# Patient Record
Sex: Male | Born: 1980 | Race: Black or African American | Hispanic: No | Marital: Single | Smoking: Current every day smoker
Health system: Southern US, Community
[De-identification: ages and names within clinical notes are randomized; demographics above are authoritative.]

## PROBLEM LIST (undated history)

## (undated) DIAGNOSIS — J939 Pneumothorax, unspecified: Secondary | ICD-10-CM

---

## 2015-07-31 ENCOUNTER — Encounter (HOSPITAL_BASED_OUTPATIENT_CLINIC_OR_DEPARTMENT_OTHER): Payer: Self-pay | Admitting: *Deleted

## 2015-07-31 ENCOUNTER — Emergency Department (HOSPITAL_BASED_OUTPATIENT_CLINIC_OR_DEPARTMENT_OTHER)
Admission: EM | Admit: 2015-07-31 | Discharge: 2015-07-31 | Disposition: A | Discharge status: Home / Self Care | Payer: Self-pay | Attending: Emergency Medicine | Admitting: Emergency Medicine

## 2015-07-31 DIAGNOSIS — F17200 Nicotine dependence, unspecified, uncomplicated: Secondary | ICD-10-CM | POA: Insufficient documentation

## 2015-07-31 DIAGNOSIS — N342 Other urethritis: Secondary | ICD-10-CM | POA: Insufficient documentation

## 2015-07-31 MED ORDER — AZITHROMYCIN 250 MG PO TABS
1000.0000 mg | ORAL_TABLET | Freq: Once | ORAL | Status: AC
  Administered 2015-07-31: 1000 mg via ORAL
  Filled 2015-07-31: qty 4

## 2015-07-31 MED ORDER — CEFTRIAXONE SODIUM 250 MG IJ SOLR
250.0000 mg | Freq: Once | INTRAMUSCULAR | Status: AC
  Administered 2015-07-31: 250 mg via INTRAMUSCULAR
  Filled 2015-07-31: qty 250

## 2015-07-31 NOTE — ED Notes (Signed)
Pt states that his girlfriend told him she has gonorrhea and said he needed to be treated, tonight having a "tingling sensation" when he urinates

## 2015-07-31 NOTE — Discharge Instructions (Signed)
Urethritis, Adult °Urethritis is an inflammation of the tube through which urine exits your bladder (urethra).  °CAUSES °Urethritis is often caused by an infection in your urethra. The infection can be viral, like herpes. The infection can also be bacterial, like gonorrhea. °RISK FACTORS °Risk factors of urethritis include: °· Having sex without using a condom. °· Having multiple sexual partners. °· Having poor hygiene. °SIGNS AND SYMPTOMS °Symptoms of urethritis are less noticeable in women than in men. These symptoms include: °· Burning feeling when you urinate (dysuria). °· Discharge from your urethra. °· Blood in your urine (hematuria). °· Urinating more than usual. °DIAGNOSIS  °To confirm a diagnosis of urethritis, your health care provider will do the following: °· Ask about your sexual history. °· Perform a physical exam. °· Have you provide a sample of your urine for lab testing. °· Use a cotton swab to gently collect a sample from your urethra for lab testing. °TREATMENT  °It is important to treat urethritis. Depending on the cause, untreated urethritis may lead to serious genital infections and possibly infertility. Urethritis caused by a bacterial infection is treated with antibiotic medicine. All sexual partners must be treated.  °HOME CARE INSTRUCTIONS °· Do not have sex until the test results are known and treatment is completed, even if your symptoms go away before you finish treatment. °· If you were prescribed an antibiotic, finish it all even if you start to feel better. °SEEK MEDICAL CARE IF:  °· Your symptoms are not improved in 3 days. °· Your symptoms are getting worse. °· You develop abdominal pain or pelvic pain (in women). °· You develop joint pain. °· You have a fever. °SEEK IMMEDIATE MEDICAL CARE IF:  °· You have severe pain in the belly, back, or side. °· You have repeated vomiting. °MAKE SURE YOU: °· Understand these instructions. °· Will watch your condition. °· Will get help right away  if you are not doing well or get worse. °  °This information is not intended to replace advice given to you by your health care provider. Make sure you discuss any questions you have with your health care provider. °  °Document Released: 12/20/2000 Document Revised: 11/10/2014 Document Reviewed: 02/24/2013 °Elsevier Interactive Patient Education ©2016 [employer] Inc[employer] ° °

## 2015-07-31 NOTE — ED Provider Notes (Signed)
CSN: [id number]     Arrival date & time 07/31/15  0444 History   First MD Initiated Contact with Patient 07/31/15 [phone]     Chief Complaint  Patient presents with  . Exposure to STD     (Consider location/radiation/quality/duration/timing/severity/associated sxs/prior Treatment) HPI  This is a 35 year old male whose girlfriend was diagnosed with, and treated for, gonorrhea 4 days ago. They have not had sexual intercourse since. He is here with "really bad" burning/tingling with urination and white urethral discharge. He denies fever or abdominal pain.  History reviewed. No pertinent past medical history. History reviewed. No pertinent past surgical history. No family history on file. Social History  Substance Use Topics  . Smoking status: Current Every Day Smoker  . Smokeless tobacco: None  . Alcohol Use: No    Review of Systems  All other systems reviewed and are negative.   Allergies  Review of patient's allergies indicates no known allergies.  Home Medications   Prior to Admission medications   Not on File   BP 138/82 mmHg  Pulse 86  Temp(Src) 98.1 F (36.7 C) (Oral)  Resp 18  Ht  (1.676 m)  Wt 170 lb (77.111 kg)  BMI 27.45 kg/m2  SpO2 100%   Physical Exam  General: Well-developed, well-nourished male in no acute distress; appearance consistent with age of record HENT: normocephalic; atraumatic Eyes: Normal appearance Neck: supple Heart: regular rate and rhythm Lungs: Normal respiratory effort and excursion Abdomen: soft; nondistended; nontender GU: Tanner 5 male, circumcised; no discharge seen at urethral meatus Extremities: No deformity; full range of motion Neurologic: Awake, alert and oriented; motor function intact in all extremities and symmetric; no facial droop Skin: Warm and dry Psychiatric: Normal mood and affect    ED Course  Procedures (including critical care time)   MDM  We'll treat for GC and Chlamydia. RPR and HIV drawn and  pending.   Paula Libra, MD 07/31/15 [phone]

## 2015-08-01 LAB — RPR: RPR: NONREACTIVE

## 2015-08-01 LAB — HIV ANTIBODY (ROUTINE TESTING W REFLEX): HIV SCREEN 4TH GENERATION: NONREACTIVE

## 2015-08-02 LAB — GC/CHLAMYDIA PROBE AMP (~~LOC~~) NOT AT ARMC
Chlamydia: NEGATIVE
Neisseria Gonorrhea: NEGATIVE

## 2017-02-11 ENCOUNTER — Emergency Department (HOSPITAL_BASED_OUTPATIENT_CLINIC_OR_DEPARTMENT_OTHER): Payer: Self-pay

## 2017-02-11 ENCOUNTER — Inpatient Hospital Stay (HOSPITAL_BASED_OUTPATIENT_CLINIC_OR_DEPARTMENT_OTHER)
Admission: EM | Admit: 2017-02-11 | Discharge: 2017-02-22 | DRG: 163 | Disposition: A | Discharge status: Home / Self Care | Payer: Self-pay | Attending: Internal Medicine | Admitting: Internal Medicine

## 2017-02-11 ENCOUNTER — Inpatient Hospital Stay (HOSPITAL_COMMUNITY): Payer: Self-pay

## 2017-02-11 ENCOUNTER — Encounter (HOSPITAL_BASED_OUTPATIENT_CLINIC_OR_DEPARTMENT_OTHER): Payer: Self-pay | Admitting: *Deleted

## 2017-02-11 ENCOUNTER — Telehealth (HOSPITAL_BASED_OUTPATIENT_CLINIC_OR_DEPARTMENT_OTHER): Payer: Self-pay | Admitting: *Deleted

## 2017-02-11 DIAGNOSIS — T4275XA Adverse effect of unspecified antiepileptic and sedative-hypnotic drugs, initial encounter: Secondary | ICD-10-CM | POA: Diagnosis present

## 2017-02-11 DIAGNOSIS — Z781 Physical restraint status: Secondary | ICD-10-CM

## 2017-02-11 DIAGNOSIS — N179 Acute kidney failure, unspecified: Secondary | ICD-10-CM | POA: Diagnosis present

## 2017-02-11 DIAGNOSIS — R339 Retention of urine, unspecified: Secondary | ICD-10-CM | POA: Diagnosis present

## 2017-02-11 DIAGNOSIS — J9382 Other air leak: Secondary | ICD-10-CM | POA: Diagnosis not present

## 2017-02-11 DIAGNOSIS — N320 Bladder-neck obstruction: Secondary | ICD-10-CM | POA: Diagnosis present

## 2017-02-11 DIAGNOSIS — R451 Restlessness and agitation: Secondary | ICD-10-CM | POA: Diagnosis present

## 2017-02-11 DIAGNOSIS — F1910 Other psychoactive substance abuse, uncomplicated: Secondary | ICD-10-CM | POA: Diagnosis present

## 2017-02-11 DIAGNOSIS — E876 Hypokalemia: Secondary | ICD-10-CM | POA: Diagnosis present

## 2017-02-11 DIAGNOSIS — Z09 Encounter for follow-up examination after completed treatment for conditions other than malignant neoplasm: Secondary | ICD-10-CM

## 2017-02-11 DIAGNOSIS — J9383 Other pneumothorax: Principal | ICD-10-CM | POA: Diagnosis present

## 2017-02-11 DIAGNOSIS — N365 Urethral false passage: Secondary | ICD-10-CM | POA: Diagnosis present

## 2017-02-11 DIAGNOSIS — Z9689 Presence of other specified functional implants: Secondary | ICD-10-CM

## 2017-02-11 DIAGNOSIS — J939 Pneumothorax, unspecified: Secondary | ICD-10-CM | POA: Insufficient documentation

## 2017-02-11 DIAGNOSIS — N359 Urethral stricture, unspecified: Secondary | ICD-10-CM | POA: Diagnosis present

## 2017-02-11 DIAGNOSIS — J9690 Respiratory failure, unspecified, unspecified whether with hypoxia or hypercapnia: Secondary | ICD-10-CM

## 2017-02-11 DIAGNOSIS — F17210 Nicotine dependence, cigarettes, uncomplicated: Secondary | ICD-10-CM | POA: Diagnosis present

## 2017-02-11 DIAGNOSIS — F1290 Cannabis use, unspecified, uncomplicated: Secondary | ICD-10-CM | POA: Diagnosis present

## 2017-02-11 DIAGNOSIS — R402413 Glasgow coma scale score 13-15, at hospital admission: Secondary | ICD-10-CM | POA: Diagnosis present

## 2017-02-11 DIAGNOSIS — R079 Chest pain, unspecified: Secondary | ICD-10-CM | POA: Diagnosis present

## 2017-02-11 DIAGNOSIS — F17200 Nicotine dependence, unspecified, uncomplicated: Secondary | ICD-10-CM | POA: Diagnosis present

## 2017-02-11 DIAGNOSIS — R338 Other retention of urine: Secondary | ICD-10-CM

## 2017-02-11 DIAGNOSIS — G92 Toxic encephalopathy: Secondary | ICD-10-CM | POA: Diagnosis present

## 2017-02-11 DIAGNOSIS — Z4682 Encounter for fitting and adjustment of non-vascular catheter: Secondary | ICD-10-CM

## 2017-02-11 DIAGNOSIS — D509 Iron deficiency anemia, unspecified: Secondary | ICD-10-CM | POA: Diagnosis present

## 2017-02-11 HISTORY — DX: Pneumothorax, unspecified: J93.9

## 2017-02-11 LAB — COMPREHENSIVE METABOLIC PANEL
ALT: 11 U/L — ABNORMAL LOW (ref 17–63)
AST: 20 U/L (ref 15–41)
Albumin: 3.9 g/dL (ref 3.5–5.0)
Alkaline Phosphatase: 39 U/L (ref 38–126)
Anion gap: 9 (ref 5–15)
BUN: 8 mg/dL (ref 6–20)
CO2: 29 mmol/L (ref 22–32)
Calcium: 8.7 mg/dL — ABNORMAL LOW (ref 8.9–10.3)
Chloride: 103 mmol/L (ref 101–111)
Creatinine, Ser: 1.29 mg/dL — ABNORMAL HIGH (ref 0.61–1.24)
GFR calc Af Amer: >60 mL/min (ref >60)
GFR calc non Af Amer: >60 mL/min (ref >60)
Glucose, Bld: 96 mg/dL (ref 65–99)
Potassium: 3.4 mmol/L — ABNORMAL LOW (ref 3.5–5.1)
Sodium: 141 mmol/L (ref 135–145)
Total Bilirubin: 0.3 mg/dL (ref 0.3–1.2)
Total Protein: 6.6 g/dL (ref 6.5–8.1)

## 2017-02-11 LAB — RAPID URINE DRUG SCREEN, HOSP PERFORMED
Amphetamines: NOT DETECTED
BENZODIAZEPINES: NOT DETECTED
Barbiturates: NOT DETECTED
Cocaine: POSITIVE — AB
Opiates: POSITIVE — AB
Tetrahydrocannabinol: POSITIVE — AB

## 2017-02-11 LAB — MRSA PCR SCREENING: MRSA by PCR: NEGATIVE

## 2017-02-11 LAB — LIPASE, BLOOD: Lipase: 20 U/L (ref 11–51)

## 2017-02-11 LAB — TROPONIN I: Troponin I: <0.03 ng/mL (ref <0.03)

## 2017-02-11 MED ORDER — FENTANYL CITRATE (PF) 100 MCG/2ML IJ SOLN
100.0000 ug | Freq: Once | INTRAMUSCULAR | Status: AC
  Administered 2017-02-11: 100 ug via INTRAVENOUS
  Filled 2017-02-11: qty 2

## 2017-02-11 MED ORDER — LORAZEPAM 2 MG/ML IJ SOLN
0.5000 mg | Freq: Once | INTRAMUSCULAR | Status: AC
  Administered 2017-02-11: 0.5 mg via INTRAVENOUS
  Filled 2017-02-11: qty 1

## 2017-02-11 MED ORDER — LIDOCAINE HCL 2 % IJ SOLN
10.0000 mL | Freq: Once | INTRAMUSCULAR | Status: AC
  Administered 2017-02-11: 200 mg via INTRADERMAL

## 2017-02-11 MED ORDER — POTASSIUM CHLORIDE CRYS ER 20 MEQ PO TBCR
20.0000 meq | EXTENDED_RELEASE_TABLET | Freq: Two times a day (BID) | ORAL | Status: DC
  Administered 2017-02-11: 20 meq via ORAL
  Filled 2017-02-11: qty 1

## 2017-02-11 MED ORDER — SODIUM CHLORIDE 0.9 % IV BOLUS (SEPSIS)
1000.0000 mL | Freq: Once | INTRAVENOUS | Status: AC
  Administered 2017-02-11: 1000 mL via INTRAVENOUS

## 2017-02-11 MED ORDER — KETOROLAC TROMETHAMINE 15 MG/ML IJ SOLN
15.0000 mg | Freq: Once | INTRAMUSCULAR | Status: AC
  Administered 2017-02-11: 15 mg via INTRAVENOUS
  Filled 2017-02-11: qty 1

## 2017-02-11 MED ORDER — LORAZEPAM 2 MG/ML IJ SOLN: 1.0000 mg | Freq: Two times a day (BID) | INTRAMUSCULAR | Status: DC | PRN

## 2017-02-11 MED ORDER — HYDROCODONE-ACETAMINOPHEN 5-325 MG PO TABS
1.0000 | ORAL_TABLET | ORAL | Status: DC | PRN
  Administered 2017-02-11 – 2017-02-12 (×4): 2 via ORAL
  Filled 2017-02-11 (×4): qty 2

## 2017-02-11 MED ORDER — SODIUM CHLORIDE 0.9 % IV SOLN
INTRAVENOUS | Status: AC
  Administered 2017-02-11: 13:00:00 via INTRAVENOUS

## 2017-02-11 MED ORDER — ONDANSETRON HCL 4 MG PO TABS: 4.0000 mg | ORAL_TABLET | Freq: Four times a day (QID) | ORAL | Status: DC | PRN

## 2017-02-11 MED ORDER — ACETAMINOPHEN 650 MG RE SUPP: 650.0000 mg | Freq: Four times a day (QID) | RECTAL | Status: DC | PRN

## 2017-02-11 MED ORDER — MORPHINE SULFATE (PF) 4 MG/ML IV SOLN
4.0000 mg | Freq: Once | INTRAVENOUS | Status: AC
  Administered 2017-02-11: 4 mg via INTRAVENOUS
  Filled 2017-02-11: qty 1

## 2017-02-11 MED ORDER — SENNOSIDES-DOCUSATE SODIUM 8.6-50 MG PO TABS: 1.0000 | ORAL_TABLET | Freq: Every evening | ORAL | Status: DC | PRN

## 2017-02-11 MED ORDER — LIDOCAINE HCL 2 % IJ SOLN
INTRAMUSCULAR | Status: AC
  Administered 2017-02-11: 200 mg via INTRADERMAL
  Filled 2017-02-11: qty 20

## 2017-02-11 MED ORDER — ACETAMINOPHEN 325 MG PO TABS
650.0000 mg | ORAL_TABLET | Freq: Four times a day (QID) | ORAL | Status: DC | PRN
  Administered 2017-02-18: 650 mg via ORAL
  Filled 2017-02-11 (×2): qty 2

## 2017-02-11 MED ORDER — ENOXAPARIN SODIUM 40 MG/0.4ML ~~LOC~~ SOLN
40.0000 mg | SUBCUTANEOUS | Status: DC
  Administered 2017-02-11 – 2017-02-18 (×8): 40 mg via SUBCUTANEOUS
  Filled 2017-02-11 (×8): qty 0.4

## 2017-02-11 MED ORDER — ONDANSETRON HCL 4 MG/2ML IJ SOLN: 4.0000 mg | Freq: Four times a day (QID) | INTRAMUSCULAR | Status: DC | PRN

## 2017-02-11 MED ORDER — MORPHINE SULFATE (PF) 2 MG/ML IV SOLN
2.0000 mg | INTRAVENOUS | Status: DC | PRN
  Administered 2017-02-11: 2 mg via INTRAVENOUS
  Filled 2017-02-11 (×2): qty 1

## 2017-02-11 MED ORDER — GI COCKTAIL ~~LOC~~
30.0000 mL | Freq: Once | ORAL | Status: DC
  Filled 2017-02-11: qty 30

## 2017-02-11 MED ORDER — KETAMINE HCL 10 MG/ML IJ SOLN
1.0000 mg/kg | Freq: Once | INTRAMUSCULAR | Status: AC
Start: 2017-02-11 — End: 2017-02-11
  Administered 2017-02-11: 05:00:00 via INTRAVENOUS

## 2017-02-11 MED ORDER — ONDANSETRON HCL 4 MG/2ML IJ SOLN
4.0000 mg | Freq: Once | INTRAMUSCULAR | Status: AC
  Administered 2017-02-11: 4 mg via INTRAVENOUS

## 2017-02-11 MED ORDER — KETAMINE HCL 50 MG/ML IJ SOLN
INTRAMUSCULAR | Status: AC
  Administered 2017-02-11: 100 mg
  Filled 2017-02-11: qty 20

## 2017-02-11 MED ORDER — TRAZODONE HCL 50 MG PO TABS
25.0000 mg | ORAL_TABLET | Freq: Every evening | ORAL | Status: DC | PRN
  Administered 2017-02-20: 25 mg via ORAL
  Filled 2017-02-11: qty 1

## 2017-02-11 MED ORDER — ONDANSETRON HCL 4 MG/2ML IJ SOLN
INTRAMUSCULAR | Status: AC
  Administered 2017-02-11: 4 mg via INTRAVENOUS
  Filled 2017-02-11: qty 2

## 2017-02-11 MED ORDER — LIDOCAINE HCL (PF) 1 % IJ SOLN
INTRAMUSCULAR | Status: AC
Start: 2017-02-11 — End: 2017-02-11
  Administered 2017-02-11: 5 mL
  Filled 2017-02-11: qty 5

## 2017-02-11 MED ORDER — KETOROLAC TROMETHAMINE 15 MG/ML IJ SOLN
15.0000 mg | Freq: Four times a day (QID) | INTRAMUSCULAR | Status: DC | PRN
  Administered 2017-02-11 – 2017-02-12 (×3): 15 mg via INTRAVENOUS
  Filled 2017-02-11 (×3): qty 1

## 2017-02-11 NOTE — ED Notes (Signed)
EDP into room, at [facility].  ?

## 2017-02-11 NOTE — Sedation Documentation (Signed)
EDP administered lidocaine.

## 2017-02-11 NOTE — ED Notes (Signed)
Pt c/o chest pain, chest pressure, refusing w/c to get to exam room, ambulatory with steady slow gait, resistant to care, blocking EKG, answering some questions, stopping triage process multiple times. EDP called to room. Family present.

## 2017-02-11 NOTE — ED Triage Notes (Addendum)
Admits to "marijuana at 1500, then lied down to sleep, woke at 2000 with chest pressure, thru to back, around my heart, belching helps, no meds PTA, hurts bad, had a cold last week for 2d", (denies: dizziness or other sx).   Alert, dramatic, animated, anxious, restless, interactive, blocking questions, resps e/u, and gasping and belching, speaking in clear choppy sentences, no dyspnea noted, skin W&D. EDP Dr. Juleen ChinaKohut into room. [facility]

## 2017-02-11 NOTE — ED Notes (Signed)
Moved to trauma room, preparing for chest tube, R chest, complete ptx.

## 2017-02-11 NOTE — Sedation Documentation (Signed)
Unable to rate pain due to procedure.  

## 2017-02-11 NOTE — Consult Note (Addendum)
Name: [name] MRN: [id number] DOB: 1981/02/21    ADMISSION DATE:  02/11/2017 CONSULTATION DATE:    REFERRING MD :  [facility]  Reason for Consultation:  R spontaneous PTX  STUDIES:  CXR 8/5 >> large R PTX with complete R lung collapse CXR 8/5 >> R chest tube in place, curves inferiorly at the mediastinum down into the gutter of R hemidiaphragm  HISTORY OF PRESENT ILLNESS:   36 year old man with little past medical history. He has reportedly had a recent URI with cough. He supposed tobacco and also THC. Per his girlfriend's report he was comfortable last night, smoked THC and then went to bed. He was awakened from sleep with sudden onset chest pain. She took him to the emergency department where he was found to have a large right pneumothorax with complete atelectasis of the right lung. [facility] he was angry, upset, uncooperative. He was given sedation, narcotics to facilitate chest tube placement. Ultimately he was placed in safety restraints. He has an elevated serum creatinine at 1.29, mild hypokalemia but no other metabolic abnormalities to explain altered mental status. Urine drug screen was not performed, it is pending now. [facility] and [facility] consulted.   PAST MEDICAL HISTORY :   has a past medical history of Pneumothorax.  has no past surgical history on file. Prior to Admission medications   Not on File   No Known Allergies  FAMILY HISTORY:  family history is not on file. SOCIAL HISTORY:  reports that he has been smoking.  He has never used smokeless tobacco. He reports that he uses drugs, including Marijuana. He reports that he does not drink alcohol.  REVIEW OF SYSTEMS:  Positives are bolded Constitutional: Negative for fever, chills, weight loss, malaise/fatigue and diaphoresis.  HENT: Negative for hearing loss, ear pain, nosebleeds, congestion, sore throat, neck pain, tinnitus and ear discharge.   Eyes: Negative for  blurred vision, double vision, photophobia, pain, discharge and redness.  Respiratory: Negative for cough, hemoptysis, sputum production, shortness of breath, wheezing and stridor.   Cardiovascular: Negative for chest pain, palpitations, orthopnea, claudication, leg swelling and PND.  Gastrointestinal: Negative for heartburn, nausea, vomiting, abdominal pain, diarrhea, constipation, blood in stool and melena.  Genitourinary: Negative for dysuria, urgency, frequency, hematuria and flank pain.  Musculoskeletal: Negative for myalgias, back pain, joint pain and falls.  Skin: Negative for itching and rash.  Neurological: Negative for dizziness, tingling, tremors, sensory change, speech change, focal weakness, seizures, loss of consciousness, weakness and headaches.  Endo/Heme/Allergies: Negative for environmental allergies and polydipsia. Does not bruise/bleed easily.  SUBJECTIVE:  States that his right chest is sore at the chest tube site  VITAL SIGNS: Temp:  [97.4 F (36.3 C)-98.2 F (36.8 C)] 98.2 F (36.8 C) (08/05 1552) Pulse Rate:  [55-124] 63 (08/05 1552) Resp:  [12-26] 16 (08/05 1552) BP: (109-165)/(73-122) 124/79 (08/05 0745) SpO2:  [94 %-100 %] 100 % (08/05 0745)  PHYSICAL EXAMINATION: General:  Thin well-developed man, hypersomnolent  Neuro:  Sleepy, difficult to rouse, moves extremities with good strength  HEENT:  Oropharynx clear, pupils pinpoint  Cardiovascular:  Regular, no murmur  Lungs:  Slightly decreased breath sounds on the right, no crackles, no wheezes, right chest tube in place with 3 chamber air leak  Abdomen:  Soft, nontender, positive bowel sounds  Musculoskeletal:  No deformity  Skin:  No rash   Recent Labs Lab 02/11/17 0400  NA 141  K 3.4*  CL 103  CO2 29  BUN 8  CREATININE 1.29*  GLUCOSE 96   No results for input(s): HGB, HCT, WBC, PLT in the last 168 hours. Dg Chest 2 View  Result Date: 02/11/2017 CLINICAL DATA:  Initial evaluation for acute  chest pain. EXAM: CHEST  2 VIEW COMPARISON:  None. FINDINGS: Cardiac and mediastinal silhouettes within normal limits. Large right pneumothorax with collapse of the right towards the medial right lung base. Mediastinal structures remain relatively midline without obvious tension component. Left lung is clear. No pulmonary edema or pleural effusion. Osseous structures within normal limits. IMPRESSION: Large right pneumothorax. No significant mediastinal shift to suggest tension component identified. Critical Value/emergent results were called by telephone at the time of interpretation on 02/11/2017 at 5:09 am to Dr. Raeford RazorSTEPHEN KOHUT , who verbally acknowledged these results. Electronically Signed   By: Rise MuBenjamin  McClintock M.D.   On: 02/11/2017 05:12   Dg Chest Portable 1 View  Result Date: 02/11/2017 CLINICAL DATA:  Chest tube placement.  Initial encounter. EXAM: PORTABLE CHEST 1 VIEW COMPARISON:  Chest radiograph performed earlier today at 4:40 a.m. FINDINGS: Status post placement of a right basilar chest tube, the right-sided pneumothorax has largely resolved, with a trace right apical pneumothorax seen. Underlying right-sided atelectasis is noted. The left lung appears clear. No pleural effusion is seen. The cardiomediastinal silhouette is normal in size. No acute osseous abnormalities are identified. IMPRESSION: Status post placement of right basilar chest tube, right-sided pneumothorax has largely resolved, with a trace residual right apical pneumothorax seen. Underlying right-sided atelectasis noted. Electronically Signed   By: Roanna RaiderJeffery  Chang M.D.   On: 02/11/2017 05:51    ASSESSMENT / PLAN: R spontaneous pneumothorax, likely due to cough and smoking.  Hypersomnolent/encephalopathy, suspect that this is due to sedating medications given on his presentation  He has reexpansion of his right lung after tube thoracostomy. Review of his chest x-ray shows that the tube is projecting down into the right gutter  at the hemidiaphragm. I would like to have it pulled back several centimeters to achieve better position. We will work on this today. Continue tube to suction -20 cm water overnight tonight. If air leak resolves on 8/6 then place on waterseal and follow chest x-rays for right lung reexpansion. Given the degree of his current air leak, may take longer for this pneumothorax to resolve. Check alpha-1 antitrypsin genotype and level.  We will follow with you.    Levy Pupaobert Byrum, MD, PhD 02/11/2017, 4:13 PM [facility] [phone] or if no answer [phone]

## 2017-02-11 NOTE — ED Notes (Addendum)
Sleeping, NAD, calm[facility].

## 2017-02-11 NOTE — ED Notes (Signed)
Pt not cooperating. [facility] At[facility] ordered.

## 2017-02-11 NOTE — ED Notes (Signed)
[facility]. Pt arousable to voice, remains sedated, speech slurred, passively interactive, NAD, calm, resps e/u shallow. Chest tube remains unremarkable to wall suction at 20cm.

## 2017-02-11 NOTE — H&P (Signed)
History and Physical    Roger SpiceDavon Gallery ZOX:[id number] DOB: [birth year] DOA: 02/11/2017  PCP: Patient, No Pcp Per Patient coming from: [facility]  Chief Complaint: chest pain  HPI: [name] is a 36 y.o. male with no significant medical hx admitted to room 7 [facility] with large right acute pneumothorax chest tube intact.  Information is obtained from the family and the chart as the patient is quite sedated due to behavioral issues that required sedation and 4. restraints. Girlfriend with whom he resides states he had a cold last week and had been doing "a lot of hard coughing". He is a smoker and he also smokes marijuana. Girlfriend said last night he smokes marijuana 8 a meal and then went to bed. During the night he awakened to sudden onset chest pain. Girlfriend reports he called her at work she picked him up and [facility]. Workup revealed large right pneumothorax and a chest tube was inserted [facility] with [facility] consultation?  Chart review indicates that during his [facility] patient's behavior was such that he ultimately required 4. restraints. Notes indicate he is uncooperative restless not participating in his care, called triage and diagnosis. He was sedated. After chest tube insertion preparing for transport patient again became combative uncooperative requiring restraints. Family reports marijuana use but no EtOH use. Workup reveals no sign of metabolic derangement infectious process no hypoxia. Family attributes behavior to "anger and attitude".  ED Course: Upon arrival he is afebrile hemodynamically stable and not hypoxic. He is quite sedated as noted above.  Review of Systems: As per HPI otherwise all other systems reviewed and are negative.   Ambulatory Status: Ambulates independently. Is independent with ADLs  Past Medical History:  Diagnosis  Date  . Pneumothorax     History reviewed. No pertinent surgical history.  Social History   Social History  . Marital status: Single    Spouse name: N/A  . Number of children: N/A  . Years of education: N/A   Occupational History  . Not on file.   Social History Main Topics  . Smoking status: Current Every Day Smoker  . Smokeless tobacco: Never Used  . Alcohol use No  . Drug use: Yes    Types: Marijuana  . Sexual activity: Not on file   Other Topics Concern  . Not on file   Social History Narrative  . No narrative on file  He lives with his girlfriend. 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  No Known Allergies  History reviewed. No pertinent family history.  Prior to Admission medications   Not on File    Physical Exam: Vitals:   02/11/17 0715 02/11/17 0730 02/11/17 0745 02/11/17 0900  BP: (!) 139/106 (!) 128/96 124/79   Pulse:  64 (!) 59   Resp: (!) 22 12 (!) 26   Temp:    (!) 97.4 F (36.3 C)  TempSrc:      SpO2:  100% 100%      General:  Appears Quite sedated. Malunions softly to sternal rub. Restraints intact. In no acute distress Eyes:  PERRL, EOMI, normal lids, iris ENT:  grossly normal hearing, lips & tongue,  Neck:  no LAD, masses or thyromegaly Cardiovascular:  RRR, no m/r/g. No LE edema.  Respiratory:  Breath sounds somewhat distant on the left. Absent  on right Abdomen:  soft, ntnd, positive bowel sounds throughout Skin:  no rash or induration seen on limited exam Musculoskeletal:  grossly normal tone BUE/BLE, good ROM, no bony abnormality Psychiatric:  grossly normal mood and affect, speech fluent and appropriate, AOx3 Neurologic:  Patient quite sedated. Moans to sternal rub. 4. restraints intact.  Labs on Admission: I have personally reviewed following labs and imaging studies  CBC: No results for input(s): WBC, NEUTROABS, HGB, HCT, MCV, PLT in the last 168 hours. Basic Metabolic Panel:  Recent Labs Lab  02/11/17 0400  NA 141  K 3.4*  CL 103  CO2 29  GLUCOSE 96  BUN 8  CREATININE 1.29*  CALCIUM 8.7*   GFR: CrCl cannot be calculated (Unknown ideal weight.). Liver Function Tests:  Recent Labs Lab 02/11/17 0400  AST 20  ALT 11*  ALKPHOS 39  BILITOT 0.3  PROT 6.6  ALBUMIN 3.9    Recent Labs Lab 02/11/17 0400  LIPASE 20   No results for input(s): AMMONIA in the last 168 hours. Coagulation Profile: No results for input(s): INR, PROTIME in the last 168 hours. Cardiac Enzymes:  Recent Labs Lab 02/11/17 0400  TROPONINI <0.03   BNP (last 3 results) No results for input(s): PROBNP in the last 8760 hours. HbA1C: No results for input(s): HGBA1C in the last 72 hours. CBG: No results for input(s): GLUCAP in the last 168 hours. Lipid Profile: No results for input(s): CHOL, HDL, LDLCALC, TRIG, CHOLHDL, LDLDIRECT in the last 72 hours. Thyroid Function Tests: No results for input(s): TSH, T4TOTAL, FREET4, T3FREE, THYROIDAB in the last 72 hours. Anemia Panel: No results for input(s): VITAMINB12, FOLATE, FERRITIN, TIBC, IRON, RETICCTPCT in the last 72 hours. Urine analysis: No results found for: COLORURINE, APPEARANCEUR, LABSPEC, PHURINE, GLUCOSEU, HGBUR, BILIRUBINUR, KETONESUR, PROTEINUR, UROBILINOGEN, NITRITE, LEUKOCYTESUR  Creatinine Clearance: CrCl cannot be calculated (Unknown ideal weight.).  Sepsis Labs: @LABRCNTIP (procalcitonin:4,lacticidven:4) )No results found for this or any previous visit (from the past 240 hour(s)).   Radiological Exams on Admission: Dg Chest 2 View  Result Date: 02/11/2017 CLINICAL DATA:  Initial evaluation for acute chest pain. EXAM: CHEST  2 VIEW COMPARISON:  None. FINDINGS: Cardiac and mediastinal silhouettes within normal limits. Large right pneumothorax with collapse of the right towards the medial right lung base. Mediastinal structures remain relatively midline without obvious tension component. Left lung is clear. No pulmonary edema  or pleural effusion. Osseous structures within normal limits. IMPRESSION: Large right pneumothorax. No significant mediastinal shift to suggest tension component identified. Critical Value/emergent results were called by telephone at the time of interpretation on 02/11/2017 at 5:09 am to Dr. Raeford RazorSTEPHEN KOHUT , who verbally acknowledged these results. Electronically Signed   By: Rise MuBenjamin  McClintock M.D.   On: 02/11/2017 05:12   Dg Chest Portable 1 View  Result Date: 02/11/2017 CLINICAL DATA:  Chest tube placement.  Initial encounter. EXAM: PORTABLE CHEST 1 VIEW COMPARISON:  Chest radiograph performed earlier today at 4:40 a.m. FINDINGS: Status post placement of a right basilar chest tube, the right-sided pneumothorax has largely resolved, with a trace right apical pneumothorax seen. Underlying right-sided atelectasis is noted. The left lung appears clear. No pleural effusion is seen. The cardiomediastinal silhouette is normal in size. No acute osseous abnormalities are identified. IMPRESSION: Status post placement of right basilar chest tube, right-sided pneumothorax has largely resolved, with a trace residual right apical pneumothorax seen. Underlying right-sided atelectasis noted. Electronically Signed   By: Roanna RaiderJeffery  Chang M.D.   On: 02/11/2017 05:51    EKG:  Independently reviewed. Sinus rhythm ST elev, probable normal early repol pattern No old tracing to compare  Assessment/Plan Principal Problem:   Pneumothorax, acute Active Problems:   Chest pain   Hypokalemia   Acute kidney injury [facility])   Drug use   Tobacco use disorder   Requires restraints for behavioral reasons   #1. Acute pneumothorax. Etiology uncertain but patient does have a history of marijuana use and long-term heavy smoker. Also recently with respiratory congestion and vigorous coughing. Initial chest x-ray with large right pneumothorax with no shift. Status post placement of chest tube x-ray reveals right-sided pneumothorax largely  resolved. Oxygen saturation level greater than 90%. -Have requested a repeat chest x-ray tomorrow -Defer other management to pulmonary  #2. Chest pain. Likely related to above. Troponin negative. EKG as noted above. Lipase within the limits of normal -Pain management -Monitor on telemetry  #3. Hypokalemia. Mild. -Replete -Recheck  4. Acute kidney injury. Likely related to decreased oral intake related to above. Creatinine 1.29. -Gentle IV fluids -Monitor urine output -Recheck in the morning  #5. Requires restraints for behavioral reasons. No indications of infectious process. No metabolic arrangement. Neuro exam without focal deficits. Presumably from pain. -Pain management -Obtain a urine drug screen -Remove restraints as able for safety  #6. Tobacco use. -offer cessation counseling when alert  #7. Drug use. Family reports marijuana use. -offer counseling -UDS   DVT prophylaxis: lovenox Code Status: full  Family Communication: mother and girlfriend at bedside  Disposition Plan: home  Consults called:  pulmonology Admission status: inpatient    Gwenyth Bender MD [facility]  If 7PM-7AM, please contact night-coverage [email or web address] Password [email or web address]  02/11/2017, 10:15 AM

## 2017-02-11 NOTE — ED Notes (Signed)
Pt to xray via stretcher

## 2017-02-11 NOTE — ED Notes (Signed)
Preparing to move pt to trauma room, EDP notified of CXR results, no changes in pt, remains awake, sedated, interactive, resps shallow e/u, NAD, calm. IVF bolus complete, 2nd liter hung kvo.

## 2017-02-11 NOTE — ED Provider Notes (Addendum)
[facility] Provider Note   CSN: [id number] Arrival date & time: 02/11/17  [id number]   By signing my name below, I, Clarisse GougeXavier Herndon, attest that this documentation has been prepared under the direction and in the presence of Raeford RazorKohut, Jermario Kalmar, MD. Electronically signed, Clarisse GougeXavier Herndon, ED Scribe. 02/11/17. 3:42 AM.  History   Chief Complaint No chief complaint on file.  The history is provided by the patient and medical records. No language interpreter was used.    [name] is a 36 y.o. male presenting to the Emergency Department concerning fluctuating, sharp central chest pain. Associated SOB following episodic spikes in severity chest pain. He describes 10/10 pressure like sensation. He states his pain was slightly relieved after belching. Worse with some movements and breathing.  He states he ate and smoked canabis before going to bed and felt fine. He woke up several hours later and smoked a cigarette. He had acute onset of pain while smoking and it has been persistent since. No known medical disorders. No h/o heart problems. No N/V or any other complaints noted at this time.   No past medical history on file.  There are no active problems to display for this patient.   No past surgical history on file.     Home Medications    Prior to Admission medications   Not on File    Family History No family history on file.  Social History Social History  Substance Use Topics  . Smoking status: Current Every Day Smoker  . Smokeless tobacco: Not on file  . Alcohol use No     Allergies   Patient has no known allergies.   Review of Systems Review of Systems  Constitutional: Negative for fever.  Respiratory: Positive for chest tightness and shortness of breath.   Cardiovascular: Positive for chest pain.  Gastrointestinal: Negative for nausea and vomiting.  Skin: Negative for color change and wound.  Allergic/Immunologic: Negative for immunocompromised state.    Neurological: Negative for dizziness, weakness, light-headedness, numbness and headaches.  Hematological: Does not bruise/bleed easily.  All other systems reviewed and are negative.    Physical Exam Updated Vital Signs There were no vitals taken for this visit.  Physical Exam  Constitutional: He is oriented to person, place, and time. He appears well-developed and well-nourished.  Uncomfortable appearing  HENT:  Head: Normocephalic and atraumatic.  Eyes: EOM are normal.  Neck: Normal range of motion.  Cardiovascular: Normal rate, regular rhythm, normal heart sounds and intact distal pulses.   Pulmonary/Chest:  Poor air movement. Pt splinting/shallow respirations.   Abdominal: Soft. He exhibits no distension. There is no tenderness.  Musculoskeletal: Normal range of motion.  Neurological: He is alert and oriented to person, place, and time.  Skin: Skin is warm and dry.  Psychiatric: He has a normal mood and affect. Judgment normal.  Nursing note and vitals reviewed.    ED Treatments / Results  DIAGNOSTIC STUDIES:  COORDINATION OF CARE: 3:34 AM-Discussed next steps with pt. Pt verbalized understanding and is agreeable with the plan. Will order medication.   Labs (all labs ordered are listed, but only abnormal results are displayed) Labs Reviewed  COMPREHENSIVE METABOLIC PANEL - Abnormal; Notable for the following:       Result Value   Potassium 3.4 (*)    Creatinine, Ser 1.29 (*)    Calcium 8.7 (*)    ALT 11 (*)    All other components within normal limits  TROPONIN I  LIPASE, BLOOD  CBC  WITH DIFFERENTIAL/PLATELET    EKG  EKG Interpretation  Date/Time:  Sunday February 11 2017 03:29:50 EDT Ventricular Rate:  89 PR Interval:    QRS Duration: 89 QT Interval:  367 QTC Calculation: 447 R Axis:   79 Text Interpretation:  Sinus rhythm ST elev, probable normal early repol pattern No old tracing to compare Confirmed by Raeford Razor [phone]) on 02/11/2017 4:02:48 AM        Radiology Dg Chest 2 View  Result Date: 02/11/2017 CLINICAL DATA:  Initial evaluation for acute chest pain. EXAM: CHEST  2 VIEW COMPARISON:  None. FINDINGS: Cardiac and mediastinal silhouettes within normal limits. Large right pneumothorax with collapse of the right towards the medial right lung base. Mediastinal structures remain relatively midline without obvious tension component. Left lung is clear. No pulmonary edema or pleural effusion. Osseous structures within normal limits. IMPRESSION: Large right pneumothorax. No significant mediastinal shift to suggest tension component identified. Critical Value/emergent results were called by telephone at the time of interpretation on 02/11/2017 at 5:09 am to Dr. Raeford Razor , who verbally acknowledged these results. Electronically Signed   By: Rise Mu M.D.   On: 02/11/2017 05:12   Dg Chest Portable 1 View  Result Date: 02/11/2017 CLINICAL DATA:  Chest tube placement.  Initial encounter. EXAM: PORTABLE CHEST 1 VIEW COMPARISON:  Chest radiograph performed earlier today at 4:40 a.m. FINDINGS: Status post placement of a right basilar chest tube, the right-sided pneumothorax has largely resolved, with a trace right apical pneumothorax seen. Underlying right-sided atelectasis is noted. The left lung appears clear. No pleural effusion is seen. The cardiomediastinal silhouette is normal in size. No acute osseous abnormalities are identified. IMPRESSION: Status post placement of right basilar chest tube, right-sided pneumothorax has largely resolved, with a trace residual right apical pneumothorax seen. Underlying right-sided atelectasis noted. Electronically Signed   By: Roanna Raider M.D.   On: 02/11/2017 05:51    Procedures CHEST TUBE INSERTION Date/Time: 02/11/2017 5:30 AM Performed by: Raeford Razor Authorized by: Raeford Razor   Consent:    Consent obtained:  Verbal and emergent situation   Consent given by:  Patient   Risks  discussed:  Bleeding, incomplete drainage, nerve damage, pain, infection and damage to surrounding structures Universal protocol:    Procedure explained and questions answered to patient or proxy's satisfaction: yes     Relevant documents present and verified: yes     Test results available and properly labeled: yes     Imaging studies available: yes     Required blood products, implants, devices, and special equipment available: yes     Site/side marked: no     Immediately prior to procedure a time out was called: yes     Patient identity confirmed:  Verbally with patient, arm band and provided demographic data Pre-procedure details:    Skin preparation:  ChloraPrep   Preparation: Patient was prepped and draped in the usual sterile fashion   Sedation:    Sedation type:  Moderate (conscious) sedation Anesthesia (see MAR for exact dosages):    Anesthesia method:  Local infiltration   Local anesthetic:  Lidocaine 1% w/o epi Procedure details:    Placement location:  R lateral   Scalpel size:  11   Tube size (Fr):  28   Dissection instrument:  Finger and Kelly clamp   Ultrasound guidance: no     Tension pneumothorax: no     Tube connected to:  Suction   Drainage characteristics:  Air only  Suture material:  2-0 silk   Dressing:  Petrolatum-impregnated gauze and 4x4 sterile gauze Post-procedure details:    Post-insertion x-ray findings: tube in good position     Patient tolerance of procedure:  Tolerated well, no immediate complications    CRITICAL CARE Performed by: Raeford RazorKOHUT, Socorro Kanitz Total critical care time: 40 minutes Critical care time was exclusive of separately billable procedures and treating other patients. Critical care was necessary to treat or prevent imminent or life-threatening deterioration. Critical care was time spent personally by me on the following activities: development of treatment plan with patient and/or surrogate as well as nursing, discussions with  consultants, evaluation of patient's response to treatment, examination of patient, obtaining history from patient or surrogate, ordering and performing treatments and interventions, ordering and review of laboratory studies, ordering and review of radiographic studies, pulse oximetry and re-evaluation of patient's condition.  Procedural Sedation  Preprocedure  Pre-anesthesia/induction confirmation of laterality/correct procedure site including "time-out."  Provider confirms review of the nurses' note, allergies, medications, pertinent labs, PMH, pre-induction vital signs, pulse oximetry, pain level, and ECG (as applicable), and patient condition satisfactory for commencing with order for sedation and procedure.  Medication(s): ketamine  Patient tolerated procedure and procedural sedation component as expected without apparent immediate complications.  Physician confirms procedural medication orders as administered, patient was assessed by physician post-procedure, and confirms post-sedation plan of care and disposition.  Total time of sedation/monitoring: 30 minutes  (including critical care time)  Medications Ordered in ED Medications - No data to display   Initial Impression / Assessment and Plan / ED Course  I have reviewed the triage vital signs and the nursing notes.  Pertinent labs & imaging results that were available during my care of the patient were reviewed by me and considered in my medical decision making (see chart for details).  Clinical Course as of Feb 11 733  Wynelle LinkSun Feb 11, 2017  0732 Pt with repeated requests to leave. Explained multiple times that completely unsafe/inappropriate to do so. Reassured that it's in his best interests to cooperate and we'll try to keep him as comfortable as we can. Multiple family at bedside and they were encouraged to reassure him.   [SK]    Clinical Course User Index [SK] Raeford RazorKohut, Yukiko Minnich, MD    36yM with acute onset of CP while smoking  cigarette. Spontaneous pneumothorax. Not hypoxic, BP fine and no mediastinal shift. Large pneumothorax though and will need transported. R CT placed. Way too deep but functional and not repositioned for time being. Will discuss with CCM. Transfer to [facility] per admitting team preference.   6:24 AM Discussed with Dr Darrick Pennaeterding, CCM/pulmonology. [facility] and pulmonology consulting for chest tube management. I appreciate Dr Francesco Runnerpyd's help in admitting to stepdown unit.   Final Clinical Impressions(s) / ED Diagnoses   Final diagnoses:  Spontaneous pneumothorax    New Prescriptions New Prescriptions   No medications on file    I personally preformed the services scribed in my presence. The recorded information has been reviewed is accurate. Raeford RazorStephen Franko Hilliker, MD.       Raeford RazorKohut, Tommy Minichiello, MD 02/11/17 [phone]

## 2017-02-11 NOTE — ED Notes (Signed)
Calmer, resting, sedated, placed on O2 [place] 2L, [facility], [facility], pending lab results, IVF bolus infusing.

## 2017-02-11 NOTE — ED Notes (Signed)
Pt restless, agitated, c/o pain, unable to follow commands, less than cooperative, Dr. Juleen ChinaKohut into room[facility]

## 2017-02-11 NOTE — ED Notes (Signed)
[facility] Attempting to rationalize with pt. Restraints removed by Dr. Juleen ChinaKohut. Pt intermittently emotional, exaggerated, frustrated, tearful, and agitated.

## 2017-02-11 NOTE — ED Notes (Signed)
Dr. Juleen ChinaKohut updated pt with procedure, plan, and process, family x2 present.

## 2017-02-11 NOTE — Sedation Documentation (Signed)
EDP inserted size 28 chest tube in R chest.

## 2017-02-12 ENCOUNTER — Inpatient Hospital Stay (HOSPITAL_COMMUNITY): Payer: Self-pay

## 2017-02-12 DIAGNOSIS — R079 Chest pain, unspecified: Secondary | ICD-10-CM

## 2017-02-12 LAB — BASIC METABOLIC PANEL
ANION GAP: 4 — AB (ref 5–15)
BUN: 5 mg/dL — ABNORMAL LOW (ref 6–20)
CALCIUM: 8.3 mg/dL — AB (ref 8.9–10.3)
CHLORIDE: 109 mmol/L (ref 101–111)
CO2: 30 mmol/L (ref 22–32)
CREATININE: 1.34 mg/dL — AB (ref 0.61–1.24)
GFR calc non Af Amer: >60 mL/min (ref >60)
Glucose, Bld: 98 mg/dL (ref 65–99)
Potassium: 4.2 mmol/L (ref 3.5–5.1)
SODIUM: 143 mmol/L (ref 135–145)

## 2017-02-12 LAB — CBC
HCT: 33 % — ABNORMAL LOW (ref 39.0–52.0)
Hemoglobin: 11 g/dL — ABNORMAL LOW (ref 13.0–17.0)
MCH: 23.7 pg — AB (ref 26.0–34.0)
MCHC: 33.3 g/dL (ref 30.0–36.0)
MCV: 71 fL — ABNORMAL LOW (ref 78.0–100.0)
PLATELETS: 167 10*3/uL (ref 150–400)
RBC: 4.65 MIL/uL (ref 4.22–5.81)
RDW: 13.7 % (ref 11.5–15.5)
WBC: 6.5 10*3/uL (ref 4.0–10.5)

## 2017-02-12 LAB — HIV ANTIBODY (ROUTINE TESTING W REFLEX): HIV Screen 4th Generation wRfx: NONREACTIVE

## 2017-02-12 MED ORDER — TRAMADOL HCL 50 MG PO TABS
50.0000 mg | ORAL_TABLET | Freq: Four times a day (QID) | ORAL | Status: DC | PRN
  Administered 2017-02-12 – 2017-02-18 (×8): 50 mg via ORAL
  Filled 2017-02-12 (×8): qty 1

## 2017-02-12 MED ORDER — OXYCODONE HCL 5 MG PO TABS
5.0000 mg | ORAL_TABLET | ORAL | Status: DC | PRN
  Administered 2017-02-12 – 2017-02-15 (×9): 10 mg via ORAL
  Filled 2017-02-12 (×9): qty 2

## 2017-02-12 MED ORDER — SODIUM CHLORIDE 0.9 % IV SOLN
INTRAVENOUS | Status: DC
  Administered 2017-02-12: 12:00:00 via INTRAVENOUS

## 2017-02-12 NOTE — Progress Notes (Signed)
[place] TEAM 1 - Stepdown/ICU TEAM  Lenoria Chimeavon Xxxmarsh  ZOX:[id number] DOB: [birth year] DOA: 02/11/2017 PCP: Patient, No Pcp Per    Brief Narrative:  36 y.o. male who smokes cigarettes and marijuana who [facility] with a large right acute pneumothorax.  He had a cold the preceding week and had been doing "a lot of hard coughing". The day of his admission he awakened to sudden onset chest pain.  In the ER a chest tube was placed, and sedation had to be administered after the pt became combative and belligerent.    Subjective: The patient reports expected right lateral chest wall pain.  He denies shortness of breath headache nausea vomiting or abdominal pain.  He admits to routine use of tobacco as well as marijuana, and feels that the onset of his symptoms was temporarily related to his use of a vaping device for the first time ever.  Assessment & Plan:  Spontaneous pneumothorax Chest tube management per critical care  Acute kidney injury crt is climbing - keep hydrated - follow trend - no clinical evidence to suggest obstruction   Agitation Likely withdrawal - much more calm and interactive at this time   Microcytic anemia  Idiopathic - may be nutrition related due to polysubstance abuse - check Fe indices   Hypokalemia Likely due to poor intake - supplemented to normal   Polysubstance abuse UDS + for cocaine and THC   DVT prophylaxis: lovenox  Code Status: FULL CODE Family Communication: no family present at time of exam  Disposition Plan: SDU while CT in place   Consultants:  PCCM  Procedures: Chest tube placement - 8/5 - in ED  Antimicrobials:  none  Objective: Blood pressure 122/71, pulse (!) 45, temperature 98.5 F (36.9 C), temperature source Oral, resp. rate 14, height 5\' 11"  (1.803 m), weight 68.7 kg (151 lb 6.4 oz), SpO2 99 %.  Intake/Output Summary (Last 24 hours) at 02/12/17 0926 Last data filed at 02/12/17 0336  Gross per 24 hour    Intake          1266.25 ml  Output             1150 ml  Net           116.25 ml   Filed Weights   02/11/17 0900  Weight: 68.7 kg (151 lb 6.4 oz)    Examination: General: No acute respiratory distress Lungs: Clear to auscultation bilaterally without wheezes or crackles Cardiovascular: Regular rate and rhythm without murmur gallop or rub normal S1 and S2 Abdomen: Nontender, nondistended, soft, bowel sounds positive, no rebound, no ascites, no appreciable mass Extremities: No significant cyanosis, clubbing, or edema bilateral lower extremities  CBC:  Recent Labs Lab 02/12/17 0226  WBC 6.5  HGB 11.0*  HCT 33.0*  MCV 71.0*  PLT 167   Basic Metabolic Panel:  Recent Labs Lab 02/11/17 0400 02/12/17 0226  NA 141 143  K 3.4* 4.2  CL 103 109  CO2 29 30  GLUCOSE 96 98  BUN 8 5*  CREATININE 1.29* 1.34*  CALCIUM 8.7* 8.3*   GFR: Estimated Creatinine Clearance: 75.5 mL/min (A) (by C-G formula based on SCr of 1.34 mg/dL (H)).  Liver Function Tests:  Recent Labs Lab 02/11/17 0400  AST 20  ALT 11*  ALKPHOS 39  BILITOT 0.3  PROT 6.6  ALBUMIN 3.9    Recent Labs Lab 02/11/17 0400  LIPASE 20    Cardiac Enzymes:  Recent Labs Lab 02/11/17  0400  TROPONINI <0.03    Recent Results (from the past 240 hour(s))  MRSA PCR Screening     Status: None   Collection Time: 02/11/17  3:47 PM  Result Value Ref Range Status   MRSA by PCR NEGATIVE NEGATIVE Final    Comment:        The GeneXpert MRSA Assay (FDA approved for NASAL specimens only), is one component of a comprehensive MRSA colonization surveillance program. It is not intended to diagnose MRSA infection nor to guide or monitor treatment for MRSA infections.      Scheduled Meds: . enoxaparin (LOVENOX) injection  40 mg Subcutaneous Q24H  . gi cocktail  30 mL Oral Once  . potassium chloride  20 mEq Oral BID     LOS: 1 day   Lonia Blood, MD [facility] Office  [phone] Pager -  Text Page per Amion as per below:  On-Call/Text Page:      [email or web address]      password [email or web address]  If 7PM-7AM, please contact night-coverage [email or web address] Password [email or web address] 02/12/2017, 9:26 AM

## 2017-02-12 NOTE — Progress Notes (Signed)
Name: [name] MRN: [id number] DOB: [birth year]    ADMISSION DATE:  02/11/2017 CONSULTATION DATE:    REFERRING MD :  [facility]elyn[facility]Hobbs[facility]  Reason for Consultation:  R spontaneous PTX  STUDIES:  CXR 8/5 >> large R PTX with complete R lung collapse CXR 8/5 >> R chest tube in place, curves inferiorly at the mediastinum down into the gutter of R hemidiaphragm CXR 8/6 > PTX increased in size. CT chest 8/6 >   SUBJECTIVE:  Has continued pain at chest tube site.  Also some pleuritic pain.  Chest tube still with significant air leak.  VITAL SIGNS: Temp:  [97.4 F (36.3 C)-98.6 F (37 C)] 98.4 F (36.9 C) (08/06 0336) Pulse Rate:  [45-89] 45 (08/06 0546) Resp:  [14-19] 14 (08/06 0546) BP: (122-149)/(71-87) 122/71 (08/06 0546) SpO2:  [97 %-99 %] 99 % (08/06 0546) Weight:  [68.7 kg (151 lb 6.4 oz)] 68.7 kg (151 lb 6.4 oz) (08/05 0900)  PHYSICAL EXAMINATION: General:  Adult male, no distress. Neuro:  A&O x 3, no deficits. HEENT:  [place]/AT. MMM. Cardiovascular:  Regular, no murmur  Lungs:  Slightly decreased breath sounds on the right, no crackles, no wheezes, right chest tube in place with persistent 3 chamber air leak. Abdomen:  Soft, nontender, positive bowel sounds.  Musculoskeletal:  No deformity.  Skin:  No rash.  Recent Labs Lab 02/11/17 0400 02/12/17 0226  NA 141 143  K 3.4* 4.2  CL 103 109  CO2 29 30  BUN 8 5*  CREATININE 1.29* 1.34*  GLUCOSE 96 98    Recent Labs Lab 02/12/17 0226  HGB 11.0*  HCT 33.0*  WBC 6.5  PLT 167   Dg Chest 2 View  Result Date: 02/12/2017 CLINICAL DATA:  Right-sided pneumothorax EXAM: CHEST  2 VIEW COMPARISON:  February 11, 2017 FINDINGS: Chest tube remains on the right, unchanged in position. The previously noted right-sided pneumothorax has become slightly larger and is now all seen along the apical and apicolateral regions. No tension component. There is subcutaneous air on the right laterally. Lungs elsewhere clear. Heart size and  pulmonary vascularity are normal. No adenopathy. No bone lesions. IMPRESSION: Pneumothorax on the right is somewhat larger than 1 day prior without tension component. Chest tube remains in place on the right. There is subcutaneous air on the right, not significantly changed. Lungs elsewhere clear. Heart size within normal limits. Electronically Signed   By: Bretta BangWilliam  Woodruff III M.D.   On: 02/12/2017 07:10   Dg Chest 2 View  Result Date: 02/11/2017 CLINICAL DATA:  Initial evaluation for acute chest pain. EXAM: CHEST  2 VIEW COMPARISON:  None. FINDINGS: Cardiac and mediastinal silhouettes within normal limits. Large right pneumothorax with collapse of the right towards the medial right lung base. Mediastinal structures remain relatively midline without obvious tension component. Left lung is clear. No pulmonary edema or pleural effusion. Osseous structures within normal limits. IMPRESSION: Large right pneumothorax. No significant mediastinal shift to suggest tension component identified. Critical Value/emergent results were called by telephone at the time of interpretation on 02/11/2017 at 5:09 am to Dr. Raeford RazorSTEPHEN KOHUT , who verbally acknowledged these results. Electronically Signed   By: Rise MuBenjamin  McClintock M.D.   On: 02/11/2017 05:12   Dg Chest Port 1 View  Result Date: 02/11/2017 CLINICAL DATA:  Follow-up pneumothorax. EXAM: PORTABLE CHEST 1 VIEW COMPARISON:  Chest radiograph February 11, 2017 at 0525 hours FINDINGS: Trace RIGHT apical pneumothorax. No mediastinal shift. RIGHT R upper lobe bandlike density, decreased from  prior examination. RIGHT chest in place, side port projecting within the chest wall though, retracted from prior examination. No pleural effusion or focal consolidation. RIGHT chest wall subcutaneous gas. Osseous structures are unchanged. IMPRESSION: Retracted chest tube.  Trace residual RIGHT apical pneumothorax. Decreased RIGHT upper lobe atelectasis. Electronically Signed   By: Awilda Metro M.D.   On: 02/11/2017 17:25   Dg Chest Portable 1 View  Result Date: 02/11/2017 CLINICAL DATA:  Chest tube placement.  Initial encounter. EXAM: PORTABLE CHEST 1 VIEW COMPARISON:  Chest radiograph performed earlier today at 4:40 a.m. FINDINGS: Status post placement of a right basilar chest tube, the right-sided pneumothorax has largely resolved, with a trace right apical pneumothorax seen. Underlying right-sided atelectasis is noted. The left lung appears clear. No pleural effusion is seen. The cardiomediastinal silhouette is normal in size. No acute osseous abnormalities are identified. IMPRESSION: Status post placement of right basilar chest tube, right-sided pneumothorax has largely resolved, with a trace residual right apical pneumothorax seen. Underlying right-sided atelectasis noted. Electronically Signed   By: Roanna Raider M.D.   On: 02/11/2017 05:51    ASSESSMENT / PLAN: R spontaneous pneumothorax, likely due to cough and smoking.  Hypersomnolent/encephalopathy, suspect that this is due to sedating medications given on his presentation.  Resolved AM 8/6.  He had reexpansion of his right lung after tube thoracostomy 8/5. Review of his chest x-ray showed that the tube was projecting down into the right gutter at the hemidiaphragm.  This was pulled back several centimeters to achieve better positioning. Chest tube was continued at -20cm suction; however, PTX slightly larger AM 8/6 and has persistent large air leak.  To further evaluate his pleural space, CT chest has been ordered. Alpha 1 antitrypsin is pending. Repeat CXR in AM.   Rutherford Guys, PA - C [place] [facility] Pager: [phone]  or [phone] 02/12/2017, 9:38 AM

## 2017-02-12 NOTE — Progress Notes (Signed)
Responded to consult for request for prayer, by pt's mother as pt could not make it. No family were in rm, and pt was asleep. Prayed silently for pt. Chaplain available for f/u.   02/12/17 1000  Clinical Encounter Type  Visited With Patient not available;Health care provider  Visit Type Initial;Psychological support;Spiritual support;Social support  Referral From Nurse   Ephraim Hamburgerynthia A Kojo Liby, Chaplain

## 2017-02-12 NOTE — Progress Notes (Signed)
Paged critical care PA to notify about patient's increased air leak. As the day has progressed patient's air leak has become increasingly audible. Celine Mansesai, PA said patient just needs more time to heal and we will continue to monitor.

## 2017-02-13 ENCOUNTER — Inpatient Hospital Stay (HOSPITAL_COMMUNITY): Payer: Self-pay

## 2017-02-13 DIAGNOSIS — R451 Restlessness and agitation: Secondary | ICD-10-CM

## 2017-02-13 DIAGNOSIS — F1910 Other psychoactive substance abuse, uncomplicated: Secondary | ICD-10-CM

## 2017-02-13 DIAGNOSIS — D509 Iron deficiency anemia, unspecified: Secondary | ICD-10-CM

## 2017-02-13 DIAGNOSIS — E876 Hypokalemia: Secondary | ICD-10-CM

## 2017-02-13 LAB — CBC
HEMATOCRIT: 32.8 % — AB (ref 39.0–52.0)
Hemoglobin: 10.9 g/dL — ABNORMAL LOW (ref 13.0–17.0)
MCH: 23.3 pg — ABNORMAL LOW (ref 26.0–34.0)
MCHC: 33.2 g/dL (ref 30.0–36.0)
MCV: 70.2 fL — AB (ref 78.0–100.0)
PLATELETS: 170 10*3/uL (ref 150–400)
RBC: 4.67 MIL/uL (ref 4.22–5.81)
RDW: 13.5 % (ref 11.5–15.5)
WBC: 4.8 10*3/uL (ref 4.0–10.5)

## 2017-02-13 LAB — RETICULOCYTES
RBC.: 4.67 MIL/uL (ref 4.22–5.81)
RETIC COUNT ABSOLUTE: 23.4 10*3/uL (ref 19.0–186.0)
RETIC CT PCT: 0.5 % (ref 0.4–3.1)

## 2017-02-13 LAB — VITAMIN B12: Vitamin B-12: 274 pg/mL (ref 180–914)

## 2017-02-13 LAB — COMPREHENSIVE METABOLIC PANEL
ALT: 10 U/L — ABNORMAL LOW (ref 17–63)
ANION GAP: 5 (ref 5–15)
AST: 18 U/L (ref 15–41)
Albumin: 3.1 g/dL — ABNORMAL LOW (ref 3.5–5.0)
Alkaline Phosphatase: 38 U/L (ref 38–126)
BILIRUBIN TOTAL: 0.4 mg/dL (ref 0.3–1.2)
BUN: 5 mg/dL — AB (ref 6–20)
CHLORIDE: 107 mmol/L (ref 101–111)
CO2: 29 mmol/L (ref 22–32)
Calcium: 8.3 mg/dL — ABNORMAL LOW (ref 8.9–10.3)
Creatinine, Ser: 1.24 mg/dL (ref 0.61–1.24)
GFR calc Af Amer: >60 mL/min (ref >60)
Glucose, Bld: 105 mg/dL — ABNORMAL HIGH (ref 65–99)
POTASSIUM: 3.6 mmol/L (ref 3.5–5.1)
Sodium: 141 mmol/L (ref 135–145)
TOTAL PROTEIN: 5.3 g/dL — AB (ref 6.5–8.1)

## 2017-02-13 LAB — FOLATE: Folate: 8.5 ng/mL (ref >5.9)

## 2017-02-13 LAB — IRON AND TIBC
Iron: 38 ug/dL — ABNORMAL LOW (ref 45–182)
Saturation Ratios: 16 % — ABNORMAL LOW (ref 17.9–39.5)
TIBC: 234 ug/dL — AB (ref 250–450)
UIBC: 196 ug/dL

## 2017-02-13 LAB — FERRITIN: FERRITIN: 47 ng/mL (ref 24–336)

## 2017-02-13 LAB — TSH: TSH: 1.323 u[IU]/mL (ref 0.350–4.500)

## 2017-02-13 MED ORDER — POTASSIUM CHLORIDE CRYS ER 20 MEQ PO TBCR
50.0000 meq | EXTENDED_RELEASE_TABLET | Freq: Once | ORAL | Status: AC
  Administered 2017-02-13: 50 meq via ORAL
  Filled 2017-02-13: qty 2

## 2017-02-13 MED ORDER — VITAMIN C 500 MG PO TABS
250.0000 mg | ORAL_TABLET | Freq: Three times a day (TID) | ORAL | Status: DC
  Administered 2017-02-13 – 2017-02-21 (×24): 250 mg via ORAL
  Filled 2017-02-13 (×26): qty 1

## 2017-02-13 MED ORDER — FERROUS SULFATE 325 (65 FE) MG PO TABS
325.0000 mg | ORAL_TABLET | Freq: Three times a day (TID) | ORAL | Status: DC
  Administered 2017-02-13 – 2017-02-20 (×20): 325 mg via ORAL
  Filled 2017-02-13 (×22): qty 1

## 2017-02-13 MED ORDER — IBUPROFEN 200 MG PO TABS
600.0000 mg | ORAL_TABLET | Freq: Three times a day (TID) | ORAL | Status: DC
  Administered 2017-02-13 – 2017-02-18 (×18): 600 mg via ORAL
  Filled 2017-02-13 (×2): qty 1
  Filled 2017-02-13: qty 3
  Filled 2017-02-13 (×15): qty 1

## 2017-02-13 NOTE — Progress Notes (Signed)
[facility] PCCM  CXR shows moderate increase in size of pneumothorax.  Place Chest tube back to water seal.  Heber CarolinaBrent McQuaid, MD [place] [facility] Pager: [phone] Cell: [phone] After 3pm or if no response, call [phone]

## 2017-02-13 NOTE — Progress Notes (Signed)
PROGRESS NOTE    Lenoria ChimeDavon Xxxmarsh  WUJ:[id number] DOB: 27-Mar-1981 DOA: 02/11/2017 PCP: Patient, No Pcp Per   Brief Narrative:  36 y.o.BM PMHx Tobacco abuse, Marijuana/Cocaine abuse, [facility]  with a large Right Acute Pneumothorax.  He had a cold the preceding week and had been doing "a lot of hard coughing". The day of his admission he awakened to sudden onset chest pain.  In the ER a chest tube was placed, and sedation had to be administered after the pt became combative and belligerent.      Subjective: 8/7 A/O 4, negative CP, negative SOB, patient was on the telephone and continued to talk on the telephone while I attempted to speak with him.   Assessment & Plan:   Principal Problem:   Pneumothorax, acute Active Problems:   Chest pain   Hypokalemia   Acute kidney injury [facility](HCC)   Drug use   Tobacco use disorder   Requires restraints for behavioral reasons  Spontaneous Right pneumothorax -[facility] -Airleak visible ~   Acute kidney injury Lab Results  Component Value Date   CREATININE 1.24 02/13/2017   CREATININE 1.34 (H) 02/12/2017   CREATININE 1.29 (H) 02/11/2017  -Resolving with hydration -Continue saline 6675ml/hr   Agitation -Resolved. Patient speaking commonly on phone.    Microcytic anemia -Most likely secondary to polysubstance abuse and poor nutrition -Anemia panel shows low iron, FE./TIBC ratio c/w iron deficiency anemia. Since patient is not overtly symptomatic will start on Fe 325 mg TID + vitamin C 250 mg TID    Hypokalemia -Most likely secondary to poor intake -K-Dur 50 mEq  Polysubstance abuse -UDS positive for cocaine/marijuana - When patient is off the phone will need to be counseled on need for absolute abstinence       DVT prophylaxis: Lovenox Code Status: Full Family Communication: None Disposition Plan: TBD   Consultants:  PCCM    Procedures/Significant Events:  8/5  Chest Tube; placed in ED   VENTILATOR SETTINGS: None   Cultures None  Antimicrobials: None   Devices None    LINES / TUBES:  8/5 Chest Tube>>    Continuous Infusions: . sodium chloride Stopped (02/13/17 0300)     Objective: Vitals:   02/12/17 1600 02/12/17 2033 02/12/17 2339 02/13/17 0412  BP: (!) 128/91 (!) 147/100 (!) 137/92 122/83  Pulse: (!) 59 (!) 50 (!) 56 (!) 52  Resp: 16 13 13  (!) 22  Temp: 98.1 F (36.7 C) 98.4 F (36.9 C) 98.6 F (37 C) 98.5 F (36.9 C)  TempSrc: Oral Oral Oral Oral  SpO2: 100% 100% 98% 100%  Weight:      Height:        Intake/Output Summary (Last 24 hours) at 02/13/17 [id number] Last data filed at 02/13/17 0600  Gross per 24 hour  Intake             1355 ml  Output             1490 ml  Net             -135 ml   Filed Weights   02/11/17 0900  Weight: 151 lb 6.4 oz (68.7 kg)    Examination:  General: A/O 4, No acute respiratory distress Neck:  Negative scars, masses, torticollis, lymphadenopathy, JVD Lungs: Clear to auscultation bilaterally without wheezes or crackles Cardiovascular: Regular rate and rhythm without murmur gallop or rub normal S1 and S2 Abdomen: negative abdominal pain,  nondistended, positive soft, bowel sounds, no rebound, no ascites, no appreciable mass Extremities: No significant cyanosis, clubbing, or edema bilateral lower extremities Skin: Multiple tattoos  Psychiatric:  Unable to assess as patient refused to hang-up phone to engage in conversation.  Central nervous system:  Cranial nerves II through XII intact, tongue/uvula midline, moves all extremities spontaneously negative dysarthria, negative expressive aphasia, negative receptive aphasia.  .     Data Reviewed: Care during the described time interval was provided by me .  I have reviewed this patient's available data, including medical history, events of note, physical examination, and all test results as part of my evaluation. I have personally  reviewed and interpreted all radiology studies.  CBC:  Recent Labs Lab 02/12/17 0226 02/13/17 0242  WBC 6.5 4.8  HGB 11.0* 10.9*  HCT 33.0* 32.8*  MCV 71.0* 70.2*  PLT 167 170   Basic Metabolic Panel:  Recent Labs Lab 02/11/17 0400 02/12/17 0226 02/13/17 0242  NA 141 143 141  K 3.4* 4.2 3.6  CL 103 109 107  CO2 29 30 29   GLUCOSE 96 98 105*  BUN 8 5* 5*  CREATININE 1.29* 1.34* 1.24  CALCIUM 8.7* 8.3* 8.3*   GFR: Estimated Creatinine Clearance: 81.6 mL/min (by C-G formula based on SCr of 1.24 mg/dL). Liver Function Tests:  Recent Labs Lab 02/11/17 0400 02/13/17 0242  AST 20 18  ALT 11* 10*  ALKPHOS 39 38  BILITOT 0.3 0.4  PROT 6.6 5.3*  ALBUMIN 3.9 3.1*    Recent Labs Lab 02/11/17 0400  LIPASE 20   No results for input(s): AMMONIA in the last 168 hours. Coagulation Profile: No results for input(s): INR, PROTIME in the last 168 hours. Cardiac Enzymes:  Recent Labs Lab 02/11/17 0400  TROPONINI <0.03   BNP (last 3 results) No results for input(s): PROBNP in the last 8760 hours. HbA1C: No results for input(s): HGBA1C in the last 72 hours. CBG: No results for input(s): GLUCAP in the last 168 hours. Lipid Profile: No results for input(s): CHOL, HDL, LDLCALC, TRIG, CHOLHDL, LDLDIRECT in the last 72 hours. Thyroid Function Tests:  Recent Labs  02/13/17 0242  TSH 1.323   Anemia Panel:  Recent Labs  02/13/17 0242  VITAMINB12 274  FOLATE 8.5  FERRITIN 47  TIBC 234*  IRON 38*  RETICCTPCT 0.5   Urine analysis: No results found for: COLORURINE, APPEARANCEUR, LABSPEC, PHURINE, GLUCOSEU, HGBUR, BILIRUBINUR, KETONESUR, PROTEINUR, UROBILINOGEN, NITRITE, LEUKOCYTESUR Sepsis Labs: @LABRCNTIP (procalcitonin:4,lacticidven:4)  ) Recent Results (from the past 240 hour(s))  MRSA PCR Screening     Status: None   Collection Time: 02/11/17  3:47 PM  Result Value Ref Range Status   MRSA by PCR NEGATIVE NEGATIVE Final    Comment:        The GeneXpert  MRSA Assay (FDA approved for NASAL specimens only), is one component of a comprehensive MRSA colonization surveillance program. It is not intended to diagnose MRSA infection nor to guide or monitor treatment for MRSA infections.          Radiology Studies: Dg Chest 2 View  Result Date: 02/12/2017 CLINICAL DATA:  Right-sided pneumothorax EXAM: CHEST  2 VIEW COMPARISON:  February 11, 2017 FINDINGS: Chest tube remains on the right, unchanged in position. The previously noted right-sided pneumothorax has become slightly larger and is now all seen along the apical and apicolateral regions. No tension component. There is subcutaneous air on the right laterally. Lungs elsewhere clear. Heart size and pulmonary vascularity are normal. No adenopathy. No  bone lesions. IMPRESSION: Pneumothorax on the right is somewhat larger than 1 day prior without tension component. Chest tube remains in place on the right. There is subcutaneous air on the right, not significantly changed. Lungs elsewhere clear. Heart size within normal limits. Electronically Signed   By: Bretta Bang III M.D.   On: 02/12/2017 07:10   Ct Chest Wo Contrast  Result Date: 02/12/2017 CLINICAL DATA:  Right chest tube in place for pneumothorax. Follow-up. EXAM: CT CHEST WITHOUT CONTRAST TECHNIQUE: Multidetector CT imaging of the chest was performed following the standard protocol without IV contrast. COMPARISON:  Plain films of earlier today and back to 1 day prior. FINDINGS: Cardiovascular: Normal caliber of the thoracic aorta. Normal heart size, without pericardial effusion. Mediastinum/Nodes: No mediastinal or definite hilar adenopathy, given limitations of unenhanced CT. Lungs/Pleura: Trace right-sided pleural fluid. Right chest tube terminates anteriorly on image 67/series 4. There is trace residual right-sided pneumothorax, including inferiorly on image 139/series 4. No left pleural fluid. Mild right lower lobe subsegmental atelectasis.  Upper Abdomen: Normal imaged portions of the liver, spleen, stomach, pancreas, adrenal glands, right kidney. Probable 5 mm left renal pelvic stone versus less likely isolated vascular calcification. Musculoskeletal: Mild right chest wall subcutaneous emphysema. IMPRESSION: 1. Right chest tube in place with minimal residual right-sided hydropneumothorax. 2. No acute superimposed thoracic process. 3. Probable left nephrolithiasis. Electronically Signed   By: Jeronimo Greaves M.D.   On: 02/12/2017 09:25   Dg Chest Port 1 View  Result Date: 02/13/2017 CLINICAL DATA:  Respiratory failure.  Pneumothorax EXAM: PORTABLE CHEST 1 VIEW COMPARISON:  02/12/2017 FINDINGS: Right chest tube unchanged in position. Small right apical pneumothorax has improved in the interval. No effusion. Both lungs are clear without infiltrate or collapse IMPRESSION: Small right apical pneumothorax improved from yesterday. Electronically Signed   By: Marlan Palau M.D.   On: 02/13/2017 07:19   Dg Chest Port 1 View  Result Date: 02/11/2017 CLINICAL DATA:  Follow-up pneumothorax. EXAM: PORTABLE CHEST 1 VIEW COMPARISON:  Chest radiograph February 11, 2017 at 0525 hours FINDINGS: Trace RIGHT apical pneumothorax. No mediastinal shift. RIGHT R upper lobe bandlike density, decreased from prior examination. RIGHT chest in place, side port projecting within the chest wall though, retracted from prior examination. No pleural effusion or focal consolidation. RIGHT chest wall subcutaneous gas. Osseous structures are unchanged. IMPRESSION: Retracted chest tube.  Trace residual RIGHT apical pneumothorax. Decreased RIGHT upper lobe atelectasis. Electronically Signed   By: Awilda Metro M.D.   On: 02/11/2017 17:25        Scheduled Meds: . enoxaparin (LOVENOX) injection  40 mg Subcutaneous Q24H   Continuous Infusions: . sodium chloride Stopped (02/13/17 0300)     LOS: 2 days    Time spent: 40 minutes    WOODS, Roselind Messier, MD [facility] Pager [phone]   If 7PM-7AM, please contact night-coverage [email or web address] Password [email or web address] 02/13/2017, 7:28 AM

## 2017-02-13 NOTE — Progress Notes (Signed)
Patient arrived to 6n13, alert and oriented, arrived with chest tube to right flank/chest area placed to -20 suction, dressing around tube dry/intact. VSS, pt reports mild pain. Oriented to room and staff, will continue to monitor.

## 2017-02-13 NOTE — Care Management Note (Signed)
Case Management Note  Patient Details  Name: Lenoria ChimeDavon Xxxmarsh MRN: [id number] Date of Birth: 01-17-1981  Subjective/Objective:      Pt admitted with spontaneous pnemo  Action/Plan:  PTA independent from home.    Pt has Chest tube placed to water seal today.  Pt will need clinic appt and possibly MATCH prior to discharge.  CM will continue to follow for discharge needs   Expected Discharge Date:  02/14/17               Expected Discharge Plan:  Home/Self Care  In-House Referral:  Clinical Social Work  Discharge planning Services  CM Consult  Post Acute Care Choice:    Choice offered to:     DME Arranged:    DME Agency:     HH Arranged:    HH Agency:     Status of Service:     If discussed at [employer] Meetings, dates discussed:    Additional Comments:  Cherylann ParrClaxton, Yomaris Palecek S, RN 02/13/2017, 10:51 AM

## 2017-02-13 NOTE — Progress Notes (Signed)
Patient over night refused his IV fluids. At shift change it was noticed that patient had pulled out his IV. Notified Dr. Joseph ArtWoods. Patient has been eating and drinking adequately and does not have much ordered IV other than the IV fluid and a PRN pain medication. Patient has been taking oral pain medications which are providing enough relief. Patient asked to not have IV restarted if he didn't have to. Dr. Joseph ArtWoods agreed to leave IV out for now. Will continue to monitor and support.

## 2017-02-13 NOTE — Progress Notes (Signed)
Patient's chest tube box showing moderate air leak, upon his arrival to floor he was sounding crepitus, I called previous nurse who had him this morning and she said he was sounding pretty coarse on the right side but I wanted to make sure with MD that since applying suction back around 1400 and still sounding crepitus that they didn't want any further intervention and MD Nicholos JohnsRamachandran said as long as vitals were okay and clinically looked fine the crepitus we might still hear but it was okay.

## 2017-02-13 NOTE — Progress Notes (Signed)
[facility] PCCM  S: some pain in chest where the tube goes into place  O:  Vitals:   02/12/17 2033 02/12/17 2339 02/13/17 0412 02/13/17 0856  BP: (!) 147/100 (!) 137/92 122/83 (!) 144/89  Pulse: (!) 50 (!) 56 (!) 52 66  Resp: 13 13 (!) 22 10  Temp: 98.4 F (36.9 C) 98.6 F (37 C) 98.5 F (36.9 C) 99 F (37.2 C)  TempSrc: Oral Oral Oral Oral  SpO2: 100% 98% 100% 100%  Weight:      Height:       Room Air  General:  Resting comfortably in bed HENT: NCAT OP clear PULM: CTA B, normal effort CV: RRR, no mgr GI: BS+, soft, nontender MSK: normal bulk and tone Neuro: awake, alert, no distress, MAEW  Chest tube: still has some air leak on suction  CXR images independently reviewed: chest tube in place, small pneumothorax on R  Impression/plan:  Pneumothorax on R due to coughing in setting of marijuana use: persistent air leak; 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

## 2017-02-14 ENCOUNTER — Inpatient Hospital Stay (HOSPITAL_COMMUNITY): Payer: Self-pay

## 2017-02-14 DIAGNOSIS — J939 Pneumothorax, unspecified: Secondary | ICD-10-CM

## 2017-02-14 DIAGNOSIS — F1990 Other psychoactive substance use, unspecified, uncomplicated: Secondary | ICD-10-CM

## 2017-02-14 DIAGNOSIS — J9383 Other pneumothorax: Secondary | ICD-10-CM

## 2017-02-14 DIAGNOSIS — F17200 Nicotine dependence, unspecified, uncomplicated: Secondary | ICD-10-CM

## 2017-02-14 NOTE — Progress Notes (Signed)
[facility] PCCM Progress Note    S: Pt upset that he still has a chest tube  O: Vitals:   02/13/17 1456 02/13/17 2124 02/14/17 0534 02/14/17 1432  BP: 132/75 (!) 133/92 (!) 123/93 112/65  Pulse: 68 (!) 55 65 64  Resp:  18 19 18   Temp: 98.7 F (37.1 C) (!) 97.5 F (36.4 C) 99.3 F (37.4 C) 98.9 F (37.2 C)  TempSrc: Oral Oral Oral Oral  SpO2: 100% 100% 99% 100%  Weight:      Height:        Recent Labs Lab 02/11/17 0400 02/12/17 0226 02/13/17 0242  NA 141 143 141  K 3.4* 4.2 3.6  CL 103 109 107  CO2 29 30 29   GLUCOSE 96 98 105*  BUN 8 5* 5*  CREATININE 1.29* 1.34* 1.24  CALCIUM 8.7* 8.3* 8.3*    Recent Labs Lab 02/12/17 0226 02/13/17 0242  HGB 11.0* 10.9*  HCT 33.0* 32.8*  WBC 6.5 4.8  PLT 167 170   General: young adult male in NAD, lying in bed  HEENT: MM pink/moist PSY: angry, poor eye contact  Neuro: AAOx4, speech clear, MAE CV: s1s2 rrr, no m/r/g PULM: even/non-labored, lungs bilaterally clear, R chest tube with 3/7 air leak XL:KGMWGI:soft, non-tender, bsx4 active  Extremities: warm/dry, no  edema  Skin: no rashes or lesions  CXR 8/8 >> images personally reviewed, right pneumothorax improved, CT in good position  A: Right Pneumothorax Pain - associated with chest tube  P: Continue chest tube to suction  Will ask CVTS to review chest tube > ? If he will need a VATS Follow intermittent CXR  Pain control per primary    PCCM will follow.  Canary BrimBrandi Aryeh Butterfield, NP-C [facility] Pgr: [phone] or if no answer [phone] 02/14/2017, 8:56 PM

## 2017-02-14 NOTE — Consult Note (Signed)
Reason for Consult:Pneumothorax Referring Physician: CCM  [name] is an 36 y.o. male.   HPI: 36 yo man presented to [facility] on 8/5 with sudden onset of right sided Cp and SOB. He was found to have a large right pneumothorax. A chest tube was placed with reexpansion of the lung. He was transferred to [facility] for further management. He has not a pneumothorax previously. He does smoke tobacco and marijuana. He had a respiratory infection for about a week prior to admission with frequent hard coughing spells. His CT was placed to water seal yesterday but the pneumothorax increased in size and the tube was placed back to suction.   Past Medical History:  Diagnosis Date  . Pneumothorax     History reviewed. No pertinent surgical history.  History reviewed. No pertinent family history.  Social History:  reports that he has been smoking.  He has never used smokeless tobacco. He reports that he uses drugs, including Marijuana. He reports that he does not drink alcohol.  Allergies: No Known Allergies  Medications:  Scheduled: . enoxaparin (LOVENOX) injection  40 mg Subcutaneous Q24H  . ferrous sulfate  325 mg Oral TID WC  . ibuprofen  600 mg Oral TID  . vitamin C  250 mg Oral TID    Results for orders placed or performed during the hospital encounter of 02/11/17 (from the past 48 hour(s))  Vitamin B12     Status: None   Collection Time: 02/13/17  2:42 AM  Result Value Ref Range   Vitamin B-12 274 180 - 914 pg/mL    Comment: (NOTE) This assay is not validated for testing neonatal or myeloproliferative syndrome specimens for Vitamin B12 levels.   Folate     Status: None   Collection Time: 02/13/17  2:42 AM  Result Value Ref Range   Folate 8.5 >5.9 ng/mL  Iron and TIBC     Status: Abnormal   Collection Time: 02/13/17  2:42 AM  Result Value Ref Range   Iron 38 (L) 45 - 182 ug/dL   TIBC 234 (L) 250 - 450 ug/dL   Saturation Ratios 16 (L) 17.9 - 39.5 %   UIBC 196  ug/dL  Ferritin     Status: None   Collection Time: 02/13/17  2:42 AM  Result Value Ref Range   Ferritin 47 24 - 336 ng/mL  Reticulocytes     Status: None   Collection Time: 02/13/17  2:42 AM  Result Value Ref Range   Retic Ct Pct 0.5 0.4 - 3.1 %   RBC. 4.67 4.22 - 5.81 MIL/uL   Retic Count, Absolute 23.4 19.0 - 186.0 K/uL  CBC     Status: Abnormal   Collection Time: 02/13/17  2:42 AM  Result Value Ref Range   WBC 4.8 4.0 - 10.5 K/uL   RBC 4.67 4.22 - 5.81 MIL/uL   Hemoglobin 10.9 (L) 13.0 - 17.0 g/dL   HCT 32.8 (L) 39.0 - 52.0 %   MCV 70.2 (L) 78.0 - 100.0 fL   MCH 23.3 (L) 26.0 - 34.0 pg   MCHC 33.2 30.0 - 36.0 g/dL   RDW 13.5 11.5 - 15.5 %   Platelets 170 150 - 400 K/uL  Comprehensive metabolic panel     Status: Abnormal   Collection Time: 02/13/17  2:42 AM  Result Value Ref Range   Sodium 141 135 - 145 mmol/L   Potassium 3.6 3.5 - 5.1 mmol/L   Chloride 107 101 - 111 mmol/L  CO2 29 22 - 32 mmol/L   Glucose, Bld 105 (H) 65 - 99 mg/dL   BUN 5 (L) 6 - 20 mg/dL   Creatinine, Ser 1.24 0.61 - 1.24 mg/dL   Calcium 8.3 (L) 8.9 - 10.3 mg/dL   Total Protein 5.3 (L) 6.5 - 8.1 g/dL   Albumin 3.1 (L) 3.5 - 5.0 g/dL   AST 18 15 - 41 U/L   ALT 10 (L) 17 - 63 U/L   Alkaline Phosphatase 38 38 - 126 U/L   Total Bilirubin 0.4 0.3 - 1.2 mg/dL   GFR calc non Af Amer >60 >60 mL/min   GFR calc Af Amer >60 >60 mL/min    Comment: (NOTE) The eGFR has been calculated using the CKD EPI equation. This calculation has not been validated in all clinical situations. eGFR's persistently <60 mL/min signify possible Chronic Kidney Disease.    Anion gap 5 5 - 15  TSH     Status: None   Collection Time: 02/13/17  2:42 AM  Result Value Ref Range   TSH 1.323 0.350 - 4.500 uIU/mL    Comment: Performed by a 3rd Generation assay with a functional sensitivity of <=0.01 uIU/mL.    Dg Chest Port 1 View  Result Date: 02/14/2017 CLINICAL DATA:  Right pneumothorax. EXAM: PORTABLE CHEST 1 VIEW  COMPARISON:  Radiograph of February 13, 2017. FINDINGS: The heart size and mediastinal contours are within normal limits. Left lung is clear. Right-sided chest tube is unchanged in position. Right apical pneumothorax is significantly smaller compared to prior exam, with only minimal pneumothorax seen currently. Left lung is clear. The visualized skeletal structures are unremarkable. IMPRESSION: Right pneumothorax is significantly improved compared to prior exam. Right-sided chest tube is unchanged in position. Electronically Signed   By: Marijo Conception, M.D.   On: 02/14/2017 08:20   Dg Chest Port 1 View  Result Date: 02/13/2017 CLINICAL DATA:  Right pneumothorax. EXAM: PORTABLE CHEST 1 VIEW COMPARISON:  Radiograph of same day. FINDINGS: The heart size and mediastinal contours are within normal limits. Left lung is clear. Right-sided chest tube is unchanged in position. Moderate right pneumothorax is noted which is significantly enlarged compared to prior exam. Stable subcutaneous emphysema seen over right lateral chest wall. The visualized skeletal structures are unremarkable. IMPRESSION: Moderate right pneumothorax is noted which is increased in size compared to prior exam. These results will be called to the ordering clinician or representative by the Radiologist Assistant, and communication documented in the PACS or zVision Dashboard. Electronically Signed   By: Marijo Conception, M.D.   On: 02/13/2017 13:47   Dg Chest Port 1 View  Result Date: 02/13/2017 CLINICAL DATA:  Respiratory failure.  Pneumothorax EXAM: PORTABLE CHEST 1 VIEW COMPARISON:  02/12/2017 FINDINGS: Right chest tube unchanged in position. Small right apical pneumothorax has improved in the interval. No effusion. Both lungs are clear without infiltrate or collapse IMPRESSION: Small right apical pneumothorax improved from yesterday. Electronically Signed   By: Franchot Gallo M.D.   On: 02/13/2017 07:19    Review of Systems  Constitutional:  Negative for chills and fever.  Eyes: Negative for blurred vision and double vision.  Respiratory: Positive for cough, sputum production and shortness of breath.   Cardiovascular: Positive for chest pain (right sided).  Gastrointestinal: Negative for nausea and vomiting.  Neurological: Negative for focal weakness and loss of consciousness.  All other systems reviewed and are negative.  Blood pressure 112/65, pulse 64, temperature 98.9 F (37.2 C), temperature  source Oral, resp. rate 18, height 5' 11"  (1.803 m), weight 151 lb 6.4 oz (68.7 kg), SpO2 100 %. Physical Exam  Vitals reviewed. Constitutional: He is oriented to person, place, and time. He appears well-developed and well-nourished. No distress.  HENT:  Head: Normocephalic and atraumatic.  Mouth/Throat: No oropharyngeal exudate.  Eyes: Conjunctivae and EOM are normal.  Neck: Neck supple.  Cardiovascular: Normal rate, regular rhythm, normal heart sounds and intact distal pulses.   No murmur heard. Respiratory: Effort normal and breath sounds normal. No respiratory distress. He has no wheezes. He has no rales.  Some subcutaneous emphysema on right  GI: Soft. He exhibits no distension. There is no tenderness.  Musculoskeletal: He exhibits no edema or deformity.  Lymphadenopathy:    He has no cervical adenopathy.  Neurological: He is alert and oriented to person, place, and time. No cranial nerve deficit. He exhibits normal muscle tone.  Skin: Skin is warm and dry.    Assessment/Plan:  36 yo man with history of tobacco and marijuana use who presented with a spontaneous pneumothorax. He has a chest tube in place with a moderate air leak. A trial of water seal was unsuccessful yesterday.   Would keep CT to suction for now. IF his air leak decreases over the next few days we can do another trial of water seal. If not will require VATS.  Will follow.  [name] 02/14/2017, 5:34 PM

## 2017-02-15 ENCOUNTER — Inpatient Hospital Stay (HOSPITAL_COMMUNITY): Payer: Self-pay

## 2017-02-15 LAB — ALPHA-1 ANTITRYPSIN PHENOTYPE
A-1 Antitrypsin Pheno: UNDETERMINED
A1 ANTITRYPSIN SER: UNDETERMINED mg/dL

## 2017-02-15 MED ORDER — OXYCODONE HCL 5 MG PO TABS: 15.0000 mg | ORAL_TABLET | ORAL | Status: DC | PRN

## 2017-02-15 MED ORDER — OXYCODONE HCL 5 MG PO TABS: 10.0000 mg | ORAL_TABLET | ORAL | Status: DC | PRN

## 2017-02-15 NOTE — Progress Notes (Signed)
[place] PCCM Progress Note    S: No new changes CXR 8/9 reviewed today with small stable apical pneumo Still has air persistent air leak   O: Vitals:   02/14/17 0534 02/14/17 1432 02/14/17 2048 02/15/17 0552  BP: (!) 123/93 112/65 117/62 123/87  Pulse: 65 64 (!) 57 (!) 55  Resp: 19 18 18 18   Temp: 99.3 F (37.4 C) 98.9 F (37.2 C) 98.7 F (37.1 C) 99.3 F (37.4 C)  TempSrc: Oral Oral Oral Oral  SpO2: 99% 100% 100% 100%  Weight:      Height:        Recent Labs Lab 02/11/17 0400 02/12/17 0226 02/13/17 0242  NA 141 143 141  K 3.4* 4.2 3.6  CL 103 109 107  CO2 29 30 29   GLUCOSE 96 98 105*  BUN 8 5* 5*  CREATININE 1.29* 1.34* 1.24  CALCIUM 8.7* 8.3* 8.3*    Recent Labs Lab 02/12/17 0226 02/13/17 0242  HGB 11.0* 10.9*  HCT 33.0* 32.8*  WBC 6.5 4.8  PLT 167 170   Gen:      No acute distress HEENT:  EOMI, sclera anicteric Neck:     No masses; no thyromegaly Lungs:    Clear to auscultation bilaterally; normal respiratory effort, Rt chest tube with air leak CV:         Regular rate and rhythm; no murmurs Abd:      + bowel sounds; soft, non-tender; no palpable masses, no distension Ext:    No edema; adequate peripheral perfusion Skin:      Warm and dry; no rash Neuro: alert and oriented x 3 Psych: normal mood and affect  CXR 8/9 >> CT tube is in stable position. Small apical pneumo. I have reviewed the images personally  A: Right Pneumothorax Pain - associated with chest tube  P: Continue chest tube to suction  Appreciate CVTS eval. May needs VATs if air leak does not resolve.  Repeat CXR tomorrow Pain control per primary   PCCM will follow.  Chilton GreathousePraveen Iren Whipp MD [facility] Pager [phone] If no answer or after 3pm call: [phone] 02/15/2017, 10:34 AM

## 2017-02-15 NOTE — Progress Notes (Signed)
      [address]       [place] [id number]             [phone]           Subjective: Patient with pain at chest tube site.  Objective: Vital signs in last 24 hours: Temp:  [98.7 F (37.1 C)-99.3 F (37.4 C)] 99.3 F (37.4 C) (08/09 0552) Pulse Rate:  [55-64] 55 (08/09 0552) Cardiac Rhythm: Normal sinus rhythm (08/09 0700) Resp:  [18] 18 (08/09 0552) BP: (112-123)/(62-87) 123/87 (08/09 0552) SpO2:  [100 %] 100 % (08/09 0552)     Intake/Output from previous day: 08/08 0701 - 08/09 0700 In: 240 [P.O.:240] Out: 100 [Chest Tube:100]   Physical Exam:  Cardiovascular: RRR Pulmonary: Clear to auscultation bilaterally Chest Tube: to suction, no air leak  Lab Results: CBC: Recent Labs  02/13/17 0242  WBC 4.8  HGB 10.9*  HCT 32.8*  PLT 170   BMET:  Recent Labs  02/13/17 0242  NA 141  K 3.6  CL 107  CO2 29  GLUCOSE 105*  BUN 5*  CREATININE 1.24  CALCIUM 8.3*    PT/INR: No results for input(s): LABPROT, INR in the last 72 hours. ABG:  INR: Will add last result for INR, ABG once components are confirmed Will add last 4 CBG results once components are confirmed  Assessment/Plan:  1. CV - SB/SRR in the 50-60's . 2.  Pulmonary - On room air. Chest tube with 100 cc of output last 24 hours. Chest tube is to suction and there is no air leak . CXR this am shows stable, small right apical pneumothorax. Chest tube to remain to suction for now. Hope to place to water seal soon. Check CXR in am.   Deacon Gadbois MPA-C 02/15/2017,8:22 AM

## 2017-02-15 NOTE — Progress Notes (Signed)
PROGRESS NOTE    [name]  MWU:[id number] DOB: Feb 22, 1981 DOA: 02/11/2017 PCP: Patient, No Pcp Per   Brief Narrative:  36 y.o.BM PMHx Tobacco abuse, Marijuana/Cocaine abuse, transferred to [facility] from [facility]  with a large Right Acute Pneumothorax.  He had a cold the preceding week and had been doing "a lot of hard coughing". The day of his admission he awakened to sudden onset chest pain.  In the ER a chest tube was placed, and sedation had to be administered after the pt became combative and belligerent.     Subjective: Afebrile, no major resp distress and good O2 sat on RA. Continue reporting pleuritic CP at chest tube insertion site.   Assessment & Plan:   Principal Problem:   Pneumothorax, acute Active Problems:   Chest pain   Hypokalemia   Acute kidney injury [facility])   Drug use   Tobacco use disorder   Requires restraints for behavioral reasons  Spontaneous Right pneumothorax -Right chest tube to waterseal per PCCM on 8/7; good re-expansion, but continue to have air leakage; now back to suction. -CVTS consulted; if air leakage continue will need VATS -continue PRN analgesics (dose adjusted for better pain control) -encourage deep breath despite discomfort, to minimize risk for PNA  Acute kidney injury Lab Results  Component Value Date   CREATININE 1.24 02/13/2017   CREATININE 1.34 (H) 02/12/2017   CREATININE 1.29 (H) 02/11/2017  -Resolved with hydration -will monitor trend intermittently   Microcytic anemia -Most likely secondary to polysubstance abuse and poor nutrition -Anemia panel shows low iron, FE./TIBC ratio c/w iron deficiency anemia.  -continue ferrous sulfate and vit C    Hypokalemia -Most likely secondary to poor intake -continue to monitor intermittently and replete as needed   Polysubstance abuse -UDS positive for cocaine/marijuana -cessation counseling once again provided -patient not receptive and expressed that he is not looking to quit  recreational habits     DVT prophylaxis: Lovenox Code Status: Full Family Communication: None Disposition Plan: TBD, remains inpatient for now.   Consultants:  PCCM CVTS  Procedures/Significant Events:  8/5 Chest Tube; placed in ED   LINES / TUBES:  8/5 Chest Tube>>   Objective: Vitals:   02/14/17 1432 02/14/17 2048 02/15/17 0552 02/15/17 1300  BP: 112/65 117/62 123/87 113/68  Pulse: 64 (!) 57 (!) 55 (!) 56  Resp: 18 18 18 18   Temp: 98.9 F (37.2 C) 98.7 F (37.1 C) 99.3 F (37.4 C) 98.5 F (36.9 C)  TempSrc: Oral Oral Oral Oral  SpO2: 100% 100% 100% 99%  Weight:      Height:        Intake/Output Summary (Last 24 hours) at 02/15/17 1512 Last data filed at 02/15/17 1100  Gross per 24 hour  Intake              240 ml  Output               50 ml  Net              190 ml   Filed Weights   02/11/17 0900  Weight: 68.7 kg (151 lb 6.4 oz)    Examination: General: AAOX3, continue complaining of pleuritic chest pain at site of chest tube insertion. Continue to be frustrated with slow to recover process. No resp distress and good O2 sat on RA.. Lungs: no wheezing, no crackles, normal resp effort.  Cardiovascular: RRR, no rubs, no gallops, no murmurs  Abdomen: soft, NT, ND, positive BS  Extremities: no cyanosis, no clubbing, no edema Skin: multiple tattoos appreciated; right costal area with chest tube in place. No rash or petechiae. Central nervous system:  No focal neurologic or motor deficit.    Data Reviewed: Care during the described time interval was provided by me .  I have reviewed this patient's available data, including medical history, events of note, physical examination, and all test results as part of my evaluation. I have personally reviewed and interpreted all radiology studies.  CBC:  Recent Labs Lab 02/12/17 0226 02/13/17 0242  WBC 6.5 4.8  HGB 11.0* 10.9*  HCT 33.0* 32.8*  MCV 71.0* 70.2*  PLT 167 170   Basic Metabolic Panel:  Recent  Labs Lab 02/11/17 0400 02/12/17 0226 02/13/17 0242  NA 141 143 141  K 3.4* 4.2 3.6  CL 103 109 107  CO2 29 30 29   GLUCOSE 96 98 105*  BUN 8 5* 5*  CREATININE 1.29* 1.34* 1.24  CALCIUM 8.7* 8.3* 8.3*   GFR: Estimated Creatinine Clearance: 81.6 mL/min (by C-G formula based on SCr of 1.24 mg/dL).   Liver Function Tests:  Recent Labs Lab 02/11/17 0400 02/13/17 0242  AST 20 18  ALT 11* 10*  ALKPHOS 39 38  BILITOT 0.3 0.4  PROT 6.6 5.3*  ALBUMIN 3.9 3.1*    Recent Labs Lab 02/11/17 0400  LIPASE 20   Cardiac Enzymes:  Recent Labs Lab 02/11/17 0400  TROPONINI <0.03   Thyroid Function Tests:  Recent Labs  02/13/17 0242  TSH 1.323   Anemia Panel:  Recent Labs  02/13/17 0242  VITAMINB12 274  FOLATE 8.5  FERRITIN 47  TIBC 234*  IRON 38*  RETICCTPCT 0.5    Recent Results (from the past 240 hour(s))  MRSA PCR Screening     Status: None   Collection Time: 02/11/17  3:47 PM  Result Value Ref Range Status   MRSA by PCR NEGATIVE NEGATIVE Final    Comment:        The GeneXpert MRSA Assay (FDA approved for NASAL specimens only), is one component of a comprehensive MRSA colonization surveillance program. It is not intended to diagnose MRSA infection nor to guide or monitor treatment for MRSA infections.      Radiology Studies: Dg Chest Port 1 View  Result Date: 02/15/2017 CLINICAL DATA:  Pneumothorax with right chest tube in place EXAM: PORTABLE CHEST 1 VIEW COMPARISON:  February 14, 2017 FINDINGS: Chest tube remains in place on the right, stable. Fairly small right apical and apicolateral pneumothorax remains without appreciable change from 1 day prior. No tension component. There is soft tissue air on the right, stable. No edema or consolidation. The heart size and pulmonary vascularity are normal. No adenopathy. No evident bone lesions. IMPRESSION: Stable chest tube position on the right with fairly small right apical and apicolateral pneumothorax. No  tension component. Stable soft tissue air on the right. No edema or consolidation. Cardiac silhouette within normal limits and stable. Electronically Signed   By: Bretta BangWilliam  Woodruff III M.D.   On: 02/15/2017 08:07   Dg Chest Port 1 View  Result Date: 02/14/2017 CLINICAL DATA:  Right pneumothorax. EXAM: PORTABLE CHEST 1 VIEW COMPARISON:  Radiograph of February 13, 2017. FINDINGS: The heart size and mediastinal contours are within normal limits. Left lung is clear. Right-sided chest tube is unchanged in position. Right apical pneumothorax is significantly smaller compared to prior exam, with only minimal pneumothorax seen currently. Left lung is clear. The visualized skeletal structures are unremarkable. IMPRESSION:  Right pneumothorax is significantly improved compared to prior exam. Right-sided chest tube is unchanged in position. Electronically Signed   By: Lupita Raider, M.D.   On: 02/14/2017 08:20    Scheduled Meds: . enoxaparin (LOVENOX) injection  40 mg Subcutaneous Q24H  . ferrous sulfate  325 mg Oral TID WC  . ibuprofen  600 mg Oral TID  . vitamin C  250 mg Oral TID   Continuous Infusions:    LOS: 4 days    Time spent: 25 minutes    Vassie Loll, MD [facility] Pager [phone]  If 7PM-7AM, please contact night-coverage [email or web address] Password [email or web address] 02/15/2017, 3:12 PM

## 2017-02-15 NOTE — Progress Notes (Signed)
      [address]       [place] [id number]             [phone]      C/o pain from tube  Still has an air leak- may be slightly smaller  CXR no change  If leak doesn't resolve over the next couple of days will need a VATS on Monday  Steven C. Dorris FetchHendrickson, MD [facility] [phone]

## 2017-02-15 NOTE — Progress Notes (Signed)
PROGRESS NOTE    [name]  LKG:[id number] DOB: [birth year] DOA: 02/11/2017 PCP: Patient, No Pcp Per   Brief Narrative:  36 y.o.BM PMHx Tobacco abuse, Marijuana/Cocaine abuse, transferred to [facility] from [facility]  with a large Right Acute Pneumothorax.  He had a cold the preceding week and had been doing "a lot of hard coughing". The day of his admission he awakened to sudden onset chest pain.  In the ER a chest tube was placed, and sedation had to be administered after the pt became combative and belligerent.     Subjective: Afebrile, no nausea, no vomiting. Reports continue to experience pleuritic CP. Roger resp effort.  Assessment & Plan:   Principal Problem:   Pneumothorax, acute Active Problems:   Chest pain   Hypokalemia   Acute kidney injury [facility])   Drug use   Tobacco use disorder   Requires restraints for behavioral reasons  Spontaneous Right pneumothorax -Right chest tube to waterseal per PCCM on 8/7; good re-expansion, but continue to have air leakage; now back to suction. -CVTS consulted; if air leakage continue will need VATS -continue PRN analgesics -encourage deep breath despite discomfort   Acute kidney injury Lab Results  Component Value Date   CREATININE 1.24 02/13/2017   CREATININE 1.34 (H) 02/12/2017   CREATININE 1.29 (H) 02/11/2017  -Resolved with hydration -will monitor trend     Microcytic anemia -Most likely secondary to polysubstance abuse and poor nutrition -Anemia panel shows low iron, FE./TIBC ratio c/w iron deficiency anemia.  -continue ferrous sulfate    Hypokalemia -Most likely secondary to poor intake -will continue monitoring and replete as needed   Polysubstance abuse -UDS positive for cocaine/marijuana -cessation counseling provided -patient not ready to quit   DVT prophylaxis: Lovenox Code Status: Full Family Communication: None Disposition Plan: TBD, remains inpatient for now.   Consultants:   PCCM CVTS  Procedures/Significant Events:  8/5 Chest Tube; placed in ED   LINES / TUBES:  8/5 Chest Tube>>    Continuous Infusions: . sodium chloride Stopped (02/13/17 0300)    Objective: Vitals:   02/13/17 2124 02/14/17 0534 02/14/17 1432 02/14/17 2048  BP: (!) 133/92 (!) 123/93 112/65 117/62  Pulse: (!) 55 65 64 (!) 57  Resp: 18 19 18 18   Temp: (!) 97.5 F (36.4 C) 99.3 F (37.4 C) 98.9 F (37.2 C) 98.7 F (37.1 C)  TempSrc: Oral Oral Oral Oral  SpO2: 100% 99% 100% 100%  Weight:      Height:        Intake/Output Summary (Last 24 hours) at 02/15/17 0025 Last data filed at 02/14/17 1433  Gross per 24 hour  Intake              360 ml  Output              300 ml  Net               60 ml   Filed Weights   02/11/17 0900  Weight: 68.7 kg (151 lb 6.4 oz)    Examination: General: AAOX3; patient frustrated and unhappy regarding how longs is his condition taking to resolved. Complaining of CP. No resp distress. Lungs: good air movement bilaterally, no wheezing, no crackles Cardiovascular: RRR, no rubs, no gallops, no murmur Abdomen: soft, NT, ND, positive BS Extremities: no cyanosis, no clubbing Skin:multiple tattoos appreciated; right costal area with chest tube in place. Central nervous system:  CN intact, no focal deficit; Roger speech and no neurologic abnormalities  appreciated.   Data Reviewed: Care during the described time interval was provided by me .  I have reviewed this patient's available data, including medical history, events of note, physical examination, and all test results as part of my evaluation. I have personally reviewed and interpreted all radiology studies.  CBC:  Recent Labs Lab 02/12/17 0226 02/13/17 0242  WBC 6.5 4.8  HGB 11.0* 10.9*  HCT 33.0* 32.8*  MCV 71.0* 70.2*  PLT 167 170   Basic Metabolic Panel:  Recent Labs Lab 02/11/17 0400 02/12/17 0226 02/13/17 0242  NA 141 143 141  K 3.4* 4.2 3.6  CL 103 109 107  CO2 29  30 29   GLUCOSE 96 98 105*  BUN 8 5* 5*  CREATININE 1.29* 1.34* 1.24  CALCIUM 8.7* 8.3* 8.3*   GFR: Estimated Creatinine Clearance: 81.6 mL/min (by C-G formula based on SCr of 1.24 mg/dL).   Liver Function Tests:  Recent Labs Lab 02/11/17 0400 02/13/17 0242  AST 20 18  ALT 11* 10*  ALKPHOS 39 38  BILITOT 0.3 0.4  PROT 6.6 5.3*  ALBUMIN 3.9 3.1*    Recent Labs Lab 02/11/17 0400  LIPASE 20   Cardiac Enzymes:  Recent Labs Lab 02/11/17 0400  TROPONINI <0.03   Thyroid Function Tests:  Recent Labs  02/13/17 0242  TSH 1.323   Anemia Panel:  Recent Labs  02/13/17 0242  VITAMINB12 274  FOLATE 8.5  FERRITIN 47  TIBC 234*  IRON 38*  RETICCTPCT 0.5    Recent Results (from the past 240 hour(s))  MRSA PCR Screening     Status: None   Collection Time: 02/11/17  3:47 PM  Result Value Ref Range Status   MRSA by PCR NEGATIVE NEGATIVE Final    Comment:        The GeneXpert MRSA Assay (FDA approved for NASAL specimens only), is one component of a comprehensive MRSA colonization surveillance program. It is not intended to diagnose MRSA infection nor to guide or monitor treatment for MRSA infections.      Radiology Studies: Dg Chest Port 1 View  Result Date: 02/14/2017 CLINICAL DATA:  Right pneumothorax. EXAM: PORTABLE CHEST 1 VIEW COMPARISON:  Radiograph of February 13, 2017. FINDINGS: The heart size and mediastinal contours are within Roger limits. Left lung is clear. Right-sided chest tube is unchanged in position. Right apical pneumothorax is significantly smaller compared to prior exam, with only minimal pneumothorax seen currently. Left lung is clear. The visualized skeletal structures are unremarkable. IMPRESSION: Right pneumothorax is significantly improved compared to prior exam. Right-sided chest tube is unchanged in position. Electronically Signed   By: Lupita Raider, M.D.   On: 02/14/2017 08:20   Dg Chest Port 1 View  Result Date: 02/13/2017 CLINICAL  DATA:  Right pneumothorax. EXAM: PORTABLE CHEST 1 VIEW COMPARISON:  Radiograph of same day. FINDINGS: The heart size and mediastinal contours are within Roger limits. Left lung is clear. Right-sided chest tube is unchanged in position. Moderate right pneumothorax is noted which is significantly enlarged compared to prior exam. Stable subcutaneous emphysema seen over right lateral chest wall. The visualized skeletal structures are unremarkable. IMPRESSION: Moderate right pneumothorax is noted which is increased in size compared to prior exam. These results will be called to the ordering clinician or representative by the Radiologist Assistant, and communication documented in the PACS or zVision Dashboard. Electronically Signed   By: Lupita Raider, M.D.   On: 02/13/2017 13:47   Dg Chest [facility] 1 View  Result  Date: 02/13/2017 CLINICAL DATA:  Respiratory failure.  Pneumothorax EXAM: PORTABLE CHEST 1 VIEW COMPARISON:  02/12/2017 FINDINGS: Right chest tube unchanged in position. Small right apical pneumothorax has improved in the interval. No effusion. Both lungs are clear without infiltrate or collapse IMPRESSION: Small right apical pneumothorax improved from yesterday. Electronically Signed   By: Marlan Palauharles  Clark M.D.   On: 02/13/2017 07:19    Scheduled Meds: . enoxaparin (LOVENOX) injection  40 mg Subcutaneous Q24H  . ferrous sulfate  325 mg Oral TID WC  . ibuprofen  600 mg Oral TID  . vitamin C  250 mg Oral TID   Continuous Infusions: . sodium chloride Stopped (02/13/17 0300)     LOS: 4 days    Time spent: 25 minutes    Vassie LollMadera, Alex Leahy, MD [facility] Pager [phone]  If 7PM-7AM, please contact night-coverage [email or web address] Password [email or web address] 02/15/2017, 12:25 AM

## 2017-02-16 ENCOUNTER — Inpatient Hospital Stay (HOSPITAL_COMMUNITY): Payer: Self-pay

## 2017-02-16 LAB — BASIC METABOLIC PANEL
ANION GAP: 9 (ref 5–15)
BUN: 7 mg/dL (ref 6–20)
CO2: 30 mmol/L (ref 22–32)
Calcium: 9.1 mg/dL (ref 8.9–10.3)
Chloride: 104 mmol/L (ref 101–111)
Creatinine, Ser: 0.96 mg/dL (ref 0.61–1.24)
GFR calc Af Amer: >60 mL/min (ref >60)
GFR calc non Af Amer: >60 mL/min (ref >60)
GLUCOSE: 91 mg/dL (ref 65–99)
POTASSIUM: 4.2 mmol/L (ref 3.5–5.1)
Sodium: 143 mmol/L (ref 135–145)

## 2017-02-16 LAB — CBC
HEMATOCRIT: 38 % — AB (ref 39.0–52.0)
Hemoglobin: 12.5 g/dL — ABNORMAL LOW (ref 13.0–17.0)
MCH: 23.4 pg — AB (ref 26.0–34.0)
MCHC: 32.9 g/dL (ref 30.0–36.0)
MCV: 71.2 fL — AB (ref 78.0–100.0)
Platelets: 220 10*3/uL (ref 150–400)
RBC: 5.34 MIL/uL (ref 4.22–5.81)
RDW: 13.6 % (ref 11.5–15.5)
WBC: 5.2 10*3/uL (ref 4.0–10.5)

## 2017-02-16 MED ORDER — OXYCODONE HCL 5 MG PO TABS
15.0000 mg | ORAL_TABLET | ORAL | Status: DC | PRN
  Administered 2017-02-17 – 2017-02-19 (×7): 15 mg via ORAL
  Filled 2017-02-16 (×7): qty 3

## 2017-02-16 NOTE — Progress Notes (Signed)
PROGRESS NOTE    [name]  ZOX:[id number] DOB: [birth year] DOA: 02/11/2017 PCP: Patient, No Pcp Per   Brief Narrative:  36 y.o.BM PMHx Tobacco abuse, Marijuana/Cocaine abuse, transferred to [facility] from [facility]  with a large Right Acute Pneumothorax.  He had a cold the preceding week and had been doing "a lot of hard coughing". The day of his admission he awakened to sudden onset chest pain.  In the ER a chest tube was placed, and sedation had to be administered after the pt became combative and belligerent.     Subjective: Afebrile, no resp distress and good O2 sat on RA. Continue experiencing pleuritic CP.  Assessment & Plan:   Principal Problem:   Pneumothorax, acute Active Problems:   Chest pain   Hypokalemia   Acute kidney injury [facility])   Drug use   Tobacco use disorder   Requires restraints for behavioral reasons  Spontaneous Right pneumothorax -Right chest tube to waterseal per PCCM on 8/7; good re-expansion, but continue to have air leakage; now back to suction. Repeat x-ray demonstrating some improvement. -CVTS consulted; if air leakage continue will need VATS. Since he continue having air leakage tentative surgery is planned for Monday 02/19/17 -continue PRN analgesics -telemetry discontinued  -encourage deep breath despite discomfort and use of IS to minimize risk for PNA  Acute kidney injury Lab Results  Component Value Date   CREATININE 0.96 02/16/2017   CREATININE 1.24 02/13/2017   CREATININE 1.34 (H) 02/12/2017  -Resolved with hydration and with current Cr of 0.96 -will monitor trend intermittently   Microcytic anemia -Most likely secondary to polysubstance abuse and poor nutrition -Anemia panel shows low iron, FE./TIBC ratio c/w iron deficiency anemia.  -continue ferrous sulfate and vit C   -Hgb 12.5 currently   Hypokalemia -repleted and WNL now -continue to monitor intermittently and replete as needed   Polysubstance abuse -UDS positive for  cocaine/marijuana on admission -cessation counseling once again provided -patient not receptive and expressed that he is not looking to quit recreational habits     DVT prophylaxis: Lovenox Code Status: Full Family Communication: None Disposition Plan: TBD, remains inpatient for now.   Consultants:  PCCM CVTS  Procedures/Significant Events:  8/5 Chest Tube; placed in ED   LINES / TUBES:  8/5 Chest Tube>>   Objective: Vitals:   02/15/17 0552 02/15/17 1300 02/15/17 2024 02/16/17 0509  BP: 123/87 113/68 127/68 121/72  Pulse: (!) 55 (!) 56 (!) 59 60  Resp: 18 18 18 18   Temp: 99.3 F (37.4 C) 98.5 F (36.9 C) 98.6 F (37 C) 98.7 F (37.1 C)  TempSrc: Oral Oral Oral Oral  SpO2: 100% 99% 100% 99%  Weight:      Height:        Intake/Output Summary (Last 24 hours) at 02/16/17 1120 Last data filed at 02/16/17 0600  Gross per 24 hour  Intake              480 ml  Output               30 ml  Net              450 ml   Filed Weights   02/11/17 0900  Weight: 68.7 kg (151 lb 6.4 oz)    Examination: General: AAOX3, continue having pleuritic pain; good O2 sat on RA. No nausea, no vomiting and denies significant resp distress.  Lungs: no wheezing, no crackles, normal resp effort.  Cardiovascular: RRR, no rubs, no  gallops, no murmurs   Abdomen: soft, NT, ND, positive BS Extremities: no cyanosis, no clubbing  Skin: skin exam unchanged from 8/9 exam. multiple tattoos appreciated; right costal area with chest tube in place. No rash or petechiae.   Data Reviewed: Care during the described time interval was provided by me .  I have reviewed this patient's available data, including medical history, events of note, physical examination, and all test results as part of my evaluation. I have personally reviewed and interpreted all radiology studies.  CBC:  Recent Labs Lab 02/12/17 0226 02/13/17 0242 02/16/17 0251  WBC 6.5 4.8 5.2  HGB 11.0* 10.9* 12.5*  HCT 33.0* 32.8* 38.0*   MCV 71.0* 70.2* 71.2*  PLT 167 170 220   Basic Metabolic Panel:  Recent Labs Lab 02/11/17 0400 02/12/17 0226 02/13/17 0242 02/16/17 0251  NA 141 143 141 143  K 3.4* 4.2 3.6 4.2  CL 103 109 107 104  CO2 29 30 29 30   GLUCOSE 96 98 105* 91  BUN 8 5* 5* 7  CREATININE 1.29* 1.34* 1.24 0.96  CALCIUM 8.7* 8.3* 8.3* 9.1   GFR: Estimated Creatinine Clearance: 105.4 mL/min (by C-G formula based on SCr of 0.96 mg/dL).   Liver Function Tests:  Recent Labs Lab 02/11/17 0400 02/13/17 0242  AST 20 18  ALT 11* 10*  ALKPHOS 39 38  BILITOT 0.3 0.4  PROT 6.6 5.3*  ALBUMIN 3.9 3.1*    Recent Labs Lab 02/11/17 0400  LIPASE 20   Cardiac Enzymes:  Recent Labs Lab 02/11/17 0400  TROPONINI <0.03    Recent Results (from the past 240 hour(s))  MRSA PCR Screening     Status: None   Collection Time: 02/11/17  3:47 PM  Result Value Ref Range Status   MRSA by PCR NEGATIVE NEGATIVE Final    Comment:        The GeneXpert MRSA Assay (FDA approved for NASAL specimens only), is one component of a comprehensive MRSA colonization surveillance program. It is not intended to diagnose MRSA infection nor to guide or monitor treatment for MRSA infections.      Radiology Studies: Dg Chest Port 1 View  Result Date: 02/16/2017 CLINICAL DATA:  Pneumothorax EXAM: PORTABLE CHEST 1 VIEW COMPARISON:  February 15, 2017 FINDINGS: Chest tube remains on the right without change in position. The apical and apicolateral pneumothorax in the right is unchanged in size and contour. No tension component. A small amount of subcutaneous air remains on the right. There is no edema or consolidation. Heart size and pulmonary vascularity are normal. No adenopathy. No evident bone lesions. IMPRESSION: Stable chest tube position on the right apical and apicolateral pneumothorax unchanged. No tension component. Lungs clear. Stable subcutaneous air on the right. Stable cardiac silhouette. Electronically Signed   By:  Bretta BangWilliam  Woodruff III M.D.   On: 02/16/2017 09:12   Dg Chest Port 1 View  Result Date: 02/15/2017 CLINICAL DATA:  Pneumothorax with right chest tube in place EXAM: PORTABLE CHEST 1 VIEW COMPARISON:  February 14, 2017 FINDINGS: Chest tube remains in place on the right, stable. Fairly small right apical and apicolateral pneumothorax remains without appreciable change from 1 day prior. No tension component. There is soft tissue air on the right, stable. No edema or consolidation. The heart size and pulmonary vascularity are normal. No adenopathy. No evident bone lesions. IMPRESSION: Stable chest tube position on the right with fairly small right apical and apicolateral pneumothorax. No tension component. Stable soft tissue air on the  right. No edema or consolidation. Cardiac silhouette within normal limits and stable. Electronically Signed   By: Bretta Bang III M.D.   On: 02/15/2017 08:07    Scheduled Meds: . enoxaparin (LOVENOX) injection  40 mg Subcutaneous Q24H  . ferrous sulfate  325 mg Oral TID WC  . ibuprofen  600 mg Oral TID  . vitamin C  250 mg Oral TID   Continuous Infusions:    LOS: 5 days    Time spent: 20 minutes    Vassie Loll, MD [facility] Pager [phone]  If 7PM-7AM, please contact night-coverage [email or web address] Password [email or web address] 02/16/2017, 11:20 AM

## 2017-02-16 NOTE — Progress Notes (Addendum)
      [address]       [place], [id number]             [phone]        Procedure(s) (LRB): VIDEO ASSISTED THORACOSCOPY (Right) BLEBECTOMY (Right) PLEURAL ABRASION (Right) Subjective: Feels ok  Objective: Vital signs in last 24 hours: Temp:  [98.5 F (36.9 C)-98.7 F (37.1 C)] 98.7 F (37.1 C) (08/10 0509) Pulse Rate:  [56-60] 60 (08/10 0509) Cardiac Rhythm: Normal sinus rhythm (08/09 1100) Resp:  [18] 18 (08/10 0509) BP: (113-127)/(68-72) 121/72 (08/10 0509) SpO2:  [99 %-100 %] 99 % (08/10 0509)  Hemodynamic parameters for last 24 hours:    Intake/Output from previous day: 08/09 0701 - 08/10 0700 In: 720 [P.O.:720] Out: 30 [Chest Tube:30] Intake/Output this shift: No intake/output data recorded.  General appearance: alert, cooperative and no distress Heart: regular rate and rhythm Lungs: clear to auscultation bilaterally  Lab Results:  Recent Labs  02/16/17 0251  WBC 5.2  HGB 12.5*  HCT 38.0*  PLT 220   BMET:  Recent Labs  02/16/17 0251  NA 143  K 4.2  CL 104  CO2 30  GLUCOSE 91  BUN 7  CREATININE 0.96  CALCIUM 9.1    PT/INR: No results for input(s): LABPROT, INR in the last 72 hours. ABG No results found for: PHART, HCO3, TCO2, ACIDBASEDEF, O2SAT CBG (last 3)  No results for input(s): GLUCAP in the last 72 hours.  Meds Scheduled Meds: . enoxaparin (LOVENOX) injection  40 mg Subcutaneous Q24H  . ferrous sulfate  325 mg Oral TID WC  . ibuprofen  600 mg Oral TID  . vitamin C  250 mg Oral TID   Continuous Infusions: PRN Meds:.acetaminophen **OR** acetaminophen, ondansetron **OR** ondansetron (ZOFRAN) IV, oxyCODONE, senna-docusate, traMADol, traZODone  Xrays Dg Chest Port 1 View  Result Date: 02/15/2017 CLINICAL DATA:  Pneumothorax with right chest tube in place EXAM: PORTABLE CHEST 1 VIEW COMPARISON:  February 14, 2017 FINDINGS: Chest tube remains in place on the right, stable. Fairly small right apical and apicolateral  pneumothorax remains without appreciable change from 1 day prior. No tension component. There is soft tissue air on the right, stable. No edema or consolidation. The heart size and pulmonary vascularity are normal. No adenopathy. No evident bone lesions. IMPRESSION: Stable chest tube position on the right with fairly small right apical and apicolateral pneumothorax. No tension component. Stable soft tissue air on the right. No edema or consolidation. Cardiac silhouette within normal limits and stable. Electronically Signed   By: Bretta BangWilliam  Woodruff III M.D.   On: 02/15/2017 08:07    Assessment/Plan: S/P Procedure(s) (LRB): VIDEO ASSISTED THORACOSCOPY (Right) BLEBECTOMY (Right) PLEURAL ABRASION (Right)  1 conts with persistent air leak, CXR is stable , cont current management- plan for VATS Monday if not resolved   LOS: 5 days    Gavin Telford E 02/16/2017

## 2017-02-16 NOTE — Progress Notes (Signed)
      [address]       [place] [id number]             [phone]       Frustrated, some pain from tube  BP (!) 112/58 (BP Location: Left Arm)   Pulse (!) 58   Temp 98.6 F (37 C) (Oral)   Resp 18   Ht 5\' 11"  (1.803 m)   Wt 151 lb 6.4 oz (68.7 kg)   SpO2 100%   BMI 21.12 kg/m   Lungs clear with equal BS bilaterally  CXR unchanged  Air leak is smaller today  IMPRESSION Spontaneous pneumothorax with ongoing air leak.   His air leak is smaller today. If it resolves over the weekend dc chest tube.  If air leak persists will plan right VATS, blebectomy, pleural abrasion on Monday  I informed him of the general nature of the procedure including the need for general anesthesia, the incisions to be used and the need for a drainage tube postop. I informed him of the indications, risks, benefits and alternatives. He understands the risks include but are not limited to bleeding, possible need for transfusion, infection, prolonged air leak, DVT, PE, or in rare instances death.  Salvatore DecentSteven C. Dorris FetchHendrickson, MD [facility] [phone]

## 2017-02-16 NOTE — Progress Notes (Signed)
[facility] PCCM Progress Note    S:  Pt reports working with IS > able to pull 2500 cc.  Denies pain / fever / cough.  Persistent air leak 1/7 today (was 3/7 on Wednesday)   O: Vitals:   02/15/17 0552 02/15/17 1300 02/15/17 2024 02/16/17 0509  BP: 123/87 113/68 127/68 121/72  Pulse: (!) 55 (!) 56 (!) 59 60  Resp: 18 18 18 18   Temp: 99.3 F (37.4 C) 98.5 F (36.9 C) 98.6 F (37 C) 98.7 F (37.1 C)  TempSrc: Oral Oral Oral Oral  SpO2: 100% 99% 100% 99%  Weight:      Height:        Recent Labs Lab 02/11/17 0400 02/12/17 0226 02/13/17 0242 02/16/17 0251  NA 141 143 141 143  K 3.4* 4.2 3.6 4.2  CL 103 109 107 104  CO2 29 30 29 30   GLUCOSE 96 98 105* 91  BUN 8 5* 5* 7  CREATININE 1.29* 1.34* 1.24 0.96  CALCIUM 8.7* 8.3* 8.3* 9.1    Recent Labs Lab 02/12/17 0226 02/13/17 0242 02/16/17 0251  HGB 11.0* 10.9* 12.5*  HCT 33.0* 32.8* 38.0*  WBC 6.5 4.8 5.2  PLT 167 170 220   General: young adult male in NAD HEENT: MM pink/moist, no jvd PSY: calm/appropriate Neuro: AAOx4, speech clear, MAE  CV: s1s2 rrr, no m/r/g PULM: even/non-labored, lungs bilaterally clear, good air movement, R chest tube 1/7 air leak  IO:NGEXGI:soft, non-tender, bsx4 active, tolerating PO's  Extremities: warm/dry, no edema  Skin: no rashes or lesions   CXR 8/10 >> images personally reviewed, no pneumothorax, CT in stable position  A: Right Pneumothorax Pain - associated with chest tube  P: Chest tube to suction  Appreciate CVTS evaluation > plan for VATS if air leak not resolved by Monday Follow intermittent CXR  Pain control per primary service   PCCM will follow up 8/13.  Please call sooner if new needs arise.   Canary BrimBrandi Mylinda Brook, NP-C [facility] Pgr: [phone] or if no answer [phone] 02/16/2017, 8:53 AM

## 2017-02-17 ENCOUNTER — Inpatient Hospital Stay (HOSPITAL_COMMUNITY): Payer: Self-pay

## 2017-02-17 NOTE — Progress Notes (Addendum)
      [address]       [place], [id number]             [phone]         Procedure(s) (LRB): VIDEO ASSISTED THORACOSCOPY (Right) BLEBECTOMY (Right) PLEURAL ABRASION (Right)  Subjective: Patient without specific complaints this am.  Objective: Vital signs in last 24 hours: Temp:  [98.4 F (36.9 C)-98.6 F (37 C)] 98.4 F (36.9 C) (08/11 0601) Pulse Rate:  [58-60] 60 (08/11 0601) Resp:  [18] 18 (08/11 0601) BP: (112-140)/(58-82) 135/78 (08/11 0601) SpO2:  [100 %] 100 % (08/11 0601)     Intake/Output from previous day: 08/10 0701 - 08/11 0700 In: 460 [P.O.:460] Out: 30 [Chest Tube:30]   Physical Exam:  Cardiovascular: RRR Pulmonary: Clear to auscultation bilaterally Chest Tube: to suction,1-2 air leak with cough  Lab Results: CBC:  Recent Labs  02/16/17 0251  WBC 5.2  HGB 12.5*  HCT 38.0*  PLT 220   BMET:   Recent Labs  02/16/17 0251  NA 143  K 4.2  CL 104  CO2 30  GLUCOSE 91  BUN 7  CREATININE 0.96  CALCIUM 9.1    PT/INR: No results for input(s): LABPROT, INR in the last 72 hours. ABG:  INR: Will add last result for INR, ABG once components are confirmed Will add last 4 CBG results once components are confirmed  Assessment/Plan:  1. CV - SB/SRR in the 50-60's . 2.  Pulmonary - On room air. Chest tube with 30 cc of output last 24 hours. Chest tube is to suction and there is a 1-2 air leak with cough. NO CXR ordered-so will order and check CXR in am. Chest to to remain to suction for now.If air leak does not resolve, will need right VATS likely Monday.  ZIMMERMAN,DONIELLE MPA-C 02/17/2017,9:50 AM

## 2017-02-17 NOTE — Progress Notes (Signed)
Pt was sitting at the edge of the bed eating his dinner when he laid down in bed he complained of chest tube site pain, given Oxycodone 15 mg, chest tube site no bleeding noted, output still the same, respiration 18,he is requesting chest x-ray for placement , he had x-ray this am, I paged Dr. Gwenlyn PerkingMadera and he said there's schedule chest x-ray tom am, but if the pain didn't subside call MD,encourage to avoid to much movement, will continue to monitor.

## 2017-02-17 NOTE — Progress Notes (Signed)
PROGRESS NOTE    Maxwell Lemen  ZOX:[id number] DOB: February 22, 1981 DOA: 02/11/2017 PCP: Patient, No Pcp Per   Brief Narrative:  36 y.o.BM PMHx Tobacco abuse, Marijuana/Cocaine abuse, transferred to [facility] from [facility]  with a large Right Acute Pneumothorax.  He had a cold the preceding week and had been doing "a lot of hard coughing". The day of his admission he awakened to sudden onset chest pain.  In the ER a chest tube was placed, and sedation had to be administered after the pt became combative and belligerent.     Subjective: No fever, no SOB and no abd pain or vomiting. Patient continue experiencing pleuritic CP.   Assessment & Plan:   Principal Problem:   Pneumothorax, acute Active Problems:   Chest pain   Hypokalemia   Acute kidney injury [facility])   Drug use   Tobacco use disorder   Requires restraints for behavioral reasons  Spontaneous Right pneumothorax -Right chest tube to waterseal per PCCM on 8/7; good re-expansion, but continue to have air leakage; now back to suction.  -CVTS on board; appreciate assistance and rec's; if air leakage continue will need VATS. Since he continue having air leakage tentative surgery is planned for Monday 02/19/17 -continue PRN analgesics -repeat CXR with small pneumothorax; but there is still signs of air leak with cough.  -Chest tube to remain to suction   -continue deep breath despite discomfort and use of IS to minimize risk for PNA  Acute kidney injury Lab Results  Component Value Date   CREATININE 0.96 02/16/2017   CREATININE 1.24 02/13/2017   CREATININE 1.34 (H) 02/12/2017  -Resolved with hydration and with current Cr of 0.96 -will monitor trend intermittently   Microcytic anemia -Most likely secondary to polysubstance abuse and poor nutrition -Anemia panel shows low iron, FE./TIBC ratio c/w iron deficiency anemia.  -continue ferrous sulfate and vit C   -last Hgb level 12.5    Hypokalemia -repleted and WNL now -continue to  monitor intermittently and replete further if needed   Polysubstance abuse -UDS positive for cocaine/marijuana on admission -cessation counseling provided -patient is not ready to quit and doesn't believe his recreational habits had anything to do with his current problem.   DVT prophylaxis: Lovenox Code Status: Full Family Communication: None Disposition Plan: TBD, remains inpatient for now.   Consultants:  PCCM CVTS  Procedures/Significant Events:  8/5 Chest Tube; placed in ED   LINES / TUBES:  8/5 Chest Tube>>   Objective: Vitals:   02/16/17 1453 02/16/17 2039 02/17/17 0601 02/17/17 1318  BP: (!) 112/58 140/82 135/78 (!) 109/49  Pulse: (!) 58 (!) 59 60   Resp: 18 18 18 18   Temp: 98.6 F (37 C) 98.6 F (37 C) 98.4 F (36.9 C) 98.5 F (36.9 C)  TempSrc: Oral Oral Oral Oral  SpO2: 100% 100% 100% 99%  Weight:      Height:        Intake/Output Summary (Last 24 hours) at 02/17/17 1818 Last data filed at 02/17/17 1700  Gross per 24 hour  Intake             1080 ml  Output               40 ml  Net             1040 ml   Filed Weights   02/11/17 0900  Weight: 68.7 kg (151 lb 6.4 oz)    Examination:  Patient physical exam is unchanged from  02/16/17. Please see below for details  General: AAOX3, continue having pleuritic pain; good O2 sat on RA. No nausea, no vomiting and denies significant resp distress.  Lungs: no wheezing, no crackles, normal resp effort.  Cardiovascular: RRR, no rubs, no gallops, no murmurs   Abdomen: soft, NT, ND, positive BS Extremities: no cyanosis, no clubbing  Skin: skin exam unchanged from 8/9 exam. multiple tattoos appreciated; right costal area with chest tube in place. No rash or petechiae.   Data Reviewed: Care during the described time interval was provided by me .  I have reviewed this patient's available data, including medical history, events of note, physical examination, and all test results as part of my evaluation. I have  personally reviewed and interpreted all radiology studies.  CBC:  Recent Labs Lab 02/12/17 0226 02/13/17 0242 02/16/17 0251  WBC 6.5 4.8 5.2  HGB 11.0* 10.9* 12.5*  HCT 33.0* 32.8* 38.0*  MCV 71.0* 70.2* 71.2*  PLT 167 170 220   Basic Metabolic Panel:  Recent Labs Lab 02/11/17 0400 02/12/17 0226 02/13/17 0242 02/16/17 0251  NA 141 143 141 143  K 3.4* 4.2 3.6 4.2  CL 103 109 107 104  CO2 29 30 29 30   GLUCOSE 96 98 105* 91  BUN 8 5* 5* 7  CREATININE 1.29* 1.34* 1.24 0.96  CALCIUM 8.7* 8.3* 8.3* 9.1   GFR: Estimated Creatinine Clearance: 105.4 mL/min (by C-G formula based on SCr of 0.96 mg/dL).   Liver Function Tests:  Recent Labs Lab 02/11/17 0400 02/13/17 0242  AST 20 18  ALT 11* 10*  ALKPHOS 39 38  BILITOT 0.3 0.4  PROT 6.6 5.3*  ALBUMIN 3.9 3.1*    Recent Labs Lab 02/11/17 0400  LIPASE 20   Cardiac Enzymes:  Recent Labs Lab 02/11/17 0400  TROPONINI <0.03    Recent Results (from the past 240 hour(s))  MRSA PCR Screening     Status: None   Collection Time: 02/11/17  3:47 PM  Result Value Ref Range Status   MRSA by PCR NEGATIVE NEGATIVE Final    Comment:        The GeneXpert MRSA Assay (FDA approved for NASAL specimens only), is one component of a comprehensive MRSA colonization surveillance program. It is not intended to diagnose MRSA infection nor to guide or monitor treatment for MRSA infections.      Radiology Studies: Dg Chest Port 1 View  Result Date: 02/16/2017 CLINICAL DATA:  Pneumothorax EXAM: PORTABLE CHEST 1 VIEW COMPARISON:  February 15, 2017 FINDINGS: Chest tube remains on the right without change in position. The apical and apicolateral pneumothorax in the right is unchanged in size and contour. No tension component. A small amount of subcutaneous air remains on the right. There is no edema or consolidation. Heart size and pulmonary vascularity are normal. No adenopathy. No evident bone lesions. IMPRESSION: Stable chest tube  position on the right apical and apicolateral pneumothorax unchanged. No tension component. Lungs clear. Stable subcutaneous air on the right. Stable cardiac silhouette. Electronically Signed   By: Bretta Bang III M.D.   On: 02/16/2017 09:12   Dg Chest Port 1v Same Day  Result Date: 02/17/2017 CLINICAL DATA:  Evaluate pneumothorax EXAM: PORTABLE CHEST 1 VIEW COMPARISON:  02/16/2017 FINDINGS: Cardiac shadow is within normal limits. Right chest tube is again seen. Tiny apical right pneumothorax is noted slightly smaller than that seen on prior exam. Left lung remains clear. No bony abnormality noted. IMPRESSION: Tiny residual right pneumothorax improved from the prior  exam. Electronically Signed   By: Alcide CleverMark  Lukens M.D.   On: 02/17/2017 11:05    Scheduled Meds: . enoxaparin (LOVENOX) injection  40 mg Subcutaneous Q24H  . ferrous sulfate  325 mg Oral TID WC  . ibuprofen  600 mg Oral TID  . vitamin C  250 mg Oral TID   Continuous Infusions:    LOS: 6 days    Time spent: 20 minutes    Vassie LollMadera, Nakari Bracknell, MD [facility] Pager [phone]  If 7PM-7AM, please contact night-coverage [email or web address] Password [email or web address] 02/17/2017, 6:18 PM

## 2017-02-18 ENCOUNTER — Inpatient Hospital Stay (HOSPITAL_COMMUNITY): Payer: Self-pay

## 2017-02-18 LAB — TYPE AND SCREEN
ABO/RH(D): A POS
ANTIBODY SCREEN: NEGATIVE

## 2017-02-18 MED ORDER — DEXTROSE 5 % IV SOLN
1.5000 g | INTRAVENOUS | Status: DC
  Filled 2017-02-18 (×2): qty 1.5

## 2017-02-18 NOTE — Progress Notes (Addendum)
      [address]       [place] [id number]             [phone]         Procedure(s) (LRB): VIDEO ASSISTED THORACOSCOPY (Right) BLEBECTOMY (Right) PLEURAL ABRASION (Right)  Subjective: Patient without specific complaints this am.  Objective: Vital signs in last 24 hours: Temp:  [97.8 F (36.6 C)-98.5 F (36.9 C)] 98.1 F (36.7 C) (08/12 0605) Pulse Rate:  [64-72] 72 (08/12 0605) Resp:  [18] 18 (08/12 0605) BP: (109-144)/(49-82) 116/68 (08/12 0605) SpO2:  [98 %-99 %] 98 % (08/12 0605)     Intake/Output from previous day: 08/11 0701 - 08/12 0700 In: 960 [P.O.:960] Out: 10 [Chest Tube:10]   Physical Exam:  Cardiovascular: RRR Pulmonary: Clear to auscultation bilaterally Chest Tube: to suction,1-2 air leak with cough  Lab Results: CBC:  Recent Labs  02/16/17 0251  WBC 5.2  HGB 12.5*  HCT 38.0*  PLT 220   BMET:   Recent Labs  02/16/17 0251  NA 143  K 4.2  CL 104  CO2 30  GLUCOSE 91  BUN 7  CREATININE 0.96  CALCIUM 9.1    PT/INR: No results for input(s): LABPROT, INR in the last 72 hours. ABG:  INR: Will add last result for INR, ABG once components are confirmed Will add last 4 CBG results once components are confirmed  Assessment/Plan:  1. CV - SB/SRR in the 50-60's . 2.  Pulmonary - On room air. Chest tube with 10 cc of output last 24 hours. Chest tube is to suction and there is an air leak that worsens with cough.CXR remains stable (small right pneumothorax). Chest to to remain to suction for now.To OR as a second case for right VATS, likely pleurodesis with Dr. Tyrone SageGerhardt.  ZIMMERMAN,DONIELLE MPA-C 02/18/2017,12:43 PM   Chart reviewed, patient examined, agree with above. He still has air leak. Plan for VATS tomorrow by Dr. Tyrone SageGerhardt. Patient is in agreement.

## 2017-02-18 NOTE — Progress Notes (Signed)
PROGRESS NOTE    [name]  ZOX:[id number] DOB: 11-Jan-1981 DOA: 02/11/2017 PCP: Patient, No Pcp Per   Brief Narrative:  36 y.o.BM PMHx Tobacco abuse, Marijuana/Cocaine abuse, transferred to [facility] from [facility]  with a large Right Acute Pneumothorax.  He had a cold the preceding week and had been doing "a lot of hard coughing". The day of his admission he awakened to sudden onset chest pain.  In the ER a chest tube was placed, and sedation had to be administered after the pt became combative and belligerent.     Subjective: Afebrile, no SOB, no nausea, no vomiting, no abd pain. Continue having pleuritic chest discomfort and signs of air lek while coughing.    Assessment & Plan:   Principal Problem:   Pneumothorax, acute Active Problems:   Chest pain   Hypokalemia   Acute kidney injury [facility])   Drug use   Tobacco use disorder   Requires restraints for behavioral reasons  Spontaneous Right pneumothorax -Right chest tube to waterseal per PCCM on 8/7; good re-expansion, but continue to have air leakage; now back to suction.  -continue PRN analgesics -repeat CXR this morning is stable (still showing small pneumothorax); patient continue having air leakage (especially when coughing). Per CVTS plans, would go for VATS tomorrow (02/19/17).   -Chest tube to remain to suction   -continue deep breath despite discomfort and use of IS to minimize risk for PNA  Acute kidney injury Lab Results  Component Value Date   CREATININE 0.96 02/16/2017   CREATININE 1.24 02/13/2017   CREATININE 1.34 (H) 02/12/2017  -Resolved with hydration and with last Cr of 0.96 on 8/10 -will follow renal function in am  Microcytic anemia -Most likely secondary to polysubstance abuse and poor nutrition -Anemia panel shows low iron, FE./TIBC ratio c/w iron deficiency anemia.  -continue ferrous sulfate and vit C   -last Hgb level 12.5   -will check CBC in am  Hypokalemia -repleted  -last blood work with  normal electrolytes -will repeat BMET in am  Polysubstance abuse -UDS positive for cocaine/marijuana on admission -cessation counseling provided -patient is not ready to quit and doesn't believe his recreational habits had anything to do with his current problem.   DVT prophylaxis: Lovenox Code Status: Full Family Communication: None Disposition Plan: remains inpatient; patient still with air leak; will require VATS tomorrow. Anticipate patient will got to CVTS service after surgery.   Consultants:  PCCM CVTS  Procedures/Significant Events:  8/5 Chest Tube; placed in ED   LINES / TUBES:  8/5 Chest Tube>>   . [START ON 02/19/2017] cefUROXime (ZINACEF)  IV    Objective: Vitals:   02/17/17 1318 02/17/17 2049 02/18/17 0605 02/18/17 1455  BP: (!) 109/49 (!) 144/82 116/68 (!) 134/93  Pulse:  64 72 73  Resp: 18 18 18 18   Temp: 98.5 F (36.9 C) 97.8 F (36.6 C) 98.1 F (36.7 C) 99.6 F (37.6 C)  TempSrc: Oral Oral Oral Oral  SpO2: 99% 98% 98% 100%  Weight:      Height:        Intake/Output Summary (Last 24 hours) at 02/18/17 1809 Last data filed at 02/18/17 0846  Gross per 24 hour  Intake              240 ml  Output                0 ml  Net  240 ml   Filed Weights   02/11/17 0900  Weight: 68.7 kg (151 lb 6.4 oz)    Examination:  General: AAOX3; in no acute distress. Still with intermittent pleuritic chest pain. good O2 sat on RA. Lungs: no wheezing, no crackles. Normal resp effort  Cardiovascular: RRR, no murmurs, no rubs, no gallops   Abdomen: soft, NT, ND, positive BS Extremities: no cyanosis, no clubbing Skin: multiple tattoos appreciated, chest tube in right costal area; no rash, no petechiae.   Data Reviewed: Care during the described time interval was provided by me .  I have reviewed this patient's available data, including medical history, events of note, physical examination, and all test results as part of my evaluation. I have personally  reviewed and interpreted all radiology studies.  CBC:  Recent Labs Lab 02/12/17 0226 02/13/17 0242 02/16/17 0251  WBC 6.5 4.8 5.2  HGB 11.0* 10.9* 12.5*  HCT 33.0* 32.8* 38.0*  MCV 71.0* 70.2* 71.2*  PLT 167 170 220   Basic Metabolic Panel:  Recent Labs Lab 02/12/17 0226 02/13/17 0242 02/16/17 0251  NA 143 141 143  K 4.2 3.6 4.2  CL 109 107 104  CO2 30 29 30   GLUCOSE 98 105* 91  BUN 5* 5* 7  CREATININE 1.34* 1.24 0.96  CALCIUM 8.3* 8.3* 9.1   GFR: Estimated Creatinine Clearance: 105.4 mL/min (by C-G formula based on SCr of 0.96 mg/dL).   Liver Function Tests:  Recent Labs Lab 02/13/17 0242  AST 18  ALT 10*  ALKPHOS 38  BILITOT 0.4  PROT 5.3*  ALBUMIN 3.1*   No results for input(s): LIPASE, AMYLASE in the last 168 hours. Cardiac Enzymes: No results for input(s): CKTOTAL, CKMB, CKMBINDEX, TROPONINI in the last 168 hours.  Recent Results (from the past 240 hour(s))  MRSA PCR Screening     Status: None   Collection Time: 02/11/17  3:47 PM  Result Value Ref Range Status   MRSA by PCR NEGATIVE NEGATIVE Final    Comment:        The GeneXpert MRSA Assay (FDA approved for NASAL specimens only), is one component of a comprehensive MRSA colonization surveillance program. It is not intended to diagnose MRSA infection nor to guide or monitor treatment for MRSA infections.      Radiology Studies: Dg Chest Port 1v Same Day  Result Date: 02/18/2017 CLINICAL DATA:  Follow-up pneumothorax EXAM: PORTABLE CHEST 1 VIEW COMPARISON:  02/17/2017 FINDINGS: Tiny residual right apical pneumothorax is noted and stable. No new focal abnormality is seen. Cardiac shadow is within normal limits. Right thoracostomy catheter is again identified. No bony abnormality is seen. IMPRESSION: Stable tiny right residual pneumothorax Electronically Signed   By: Alcide CleverMark  Lukens M.D.   On: 02/18/2017 07:07   Dg Chest Port 1v Same Day  Result Date: 02/17/2017 CLINICAL DATA:  Evaluate  pneumothorax EXAM: PORTABLE CHEST 1 VIEW COMPARISON:  02/16/2017 FINDINGS: Cardiac shadow is within normal limits. Right chest tube is again seen. Tiny apical right pneumothorax is noted slightly smaller than that seen on prior exam. Left lung remains clear. No bony abnormality noted. IMPRESSION: Tiny residual right pneumothorax improved from the prior exam. Electronically Signed   By: Alcide CleverMark  Lukens M.D.   On: 02/17/2017 11:05    Scheduled Meds: . enoxaparin (LOVENOX) injection  40 mg Subcutaneous Q24H  . ferrous sulfate  325 mg Oral TID WC  . ibuprofen  600 mg Oral TID  . vitamin C  250 mg Oral TID   Continuous  Infusions: . [START ON 02/19/2017] cefUROXime (ZINACEF)  IV       LOS: 7 days    Time spent: 20 minutes    Vassie Loll, MD [facility] Pager [phone]  If 7PM-7AM, please contact night-coverage [email or web address] Password [facility] 02/18/2017, 6:09 PM

## 2017-02-19 ENCOUNTER — Inpatient Hospital Stay (HOSPITAL_COMMUNITY): Payer: Self-pay | Admitting: Anesthesiology

## 2017-02-19 ENCOUNTER — Inpatient Hospital Stay (HOSPITAL_COMMUNITY): Payer: Self-pay

## 2017-02-19 ENCOUNTER — Encounter (HOSPITAL_COMMUNITY)
Admission: EM | Disposition: A | Discharge status: Home / Self Care | Payer: Self-pay | Source: Home / Self Care | Attending: Internal Medicine

## 2017-02-19 ENCOUNTER — Encounter (HOSPITAL_COMMUNITY): Payer: Self-pay | Admitting: Anesthesiology

## 2017-02-19 DIAGNOSIS — J9311 Primary spontaneous pneumothorax: Secondary | ICD-10-CM

## 2017-02-19 HISTORY — PX: CYSTOSCOPY: SHX5120

## 2017-02-19 HISTORY — PX: VIDEO ASSISTED THORACOSCOPY: SHX5073

## 2017-02-19 LAB — CBC
HCT: 34.8 % — ABNORMAL LOW (ref 39.0–52.0)
HEMOGLOBIN: 11.6 g/dL — AB (ref 13.0–17.0)
MCH: 23.4 pg — ABNORMAL LOW (ref 26.0–34.0)
MCHC: 33.3 g/dL (ref 30.0–36.0)
MCV: 70.2 fL — ABNORMAL LOW (ref 78.0–100.0)
Platelets: 289 10*3/uL (ref 150–400)
RBC: 4.96 MIL/uL (ref 4.22–5.81)
RDW: 13.3 % (ref 11.5–15.5)
WBC: 8.4 10*3/uL (ref 4.0–10.5)

## 2017-02-19 LAB — BASIC METABOLIC PANEL
Anion gap: 8 (ref 5–15)
BUN: 6 mg/dL (ref 6–20)
CALCIUM: 8.8 mg/dL — AB (ref 8.9–10.3)
CO2: 31 mmol/L (ref 22–32)
CREATININE: 0.91 mg/dL (ref 0.61–1.24)
Chloride: 102 mmol/L (ref 101–111)
Glucose, Bld: 86 mg/dL (ref 65–99)
Potassium: 3.7 mmol/L (ref 3.5–5.1)
SODIUM: 141 mmol/L (ref 135–145)

## 2017-02-19 LAB — ABO/RH: ABO/RH(D): A POS

## 2017-02-19 SURGERY — VIDEO ASSISTED THORACOSCOPY
Anesthesia: General | Site: Chest | Laterality: Right

## 2017-02-19 MED ORDER — OXYCODONE HCL 5 MG PO TABS
ORAL_TABLET | ORAL | Status: AC
  Administered 2017-02-19: 10 mg via ORAL
  Filled 2017-02-19: qty 2

## 2017-02-19 MED ORDER — DEXAMETHASONE SODIUM PHOSPHATE 10 MG/ML IJ SOLN
INTRAMUSCULAR | Status: DC | PRN
  Administered 2017-02-19: 5 mg via INTRAVENOUS

## 2017-02-19 MED ORDER — ARTIFICIAL TEARS OPHTHALMIC OINT
TOPICAL_OINTMENT | OPHTHALMIC | Status: AC
  Filled 2017-02-19: qty 3.5

## 2017-02-19 MED ORDER — PROPOFOL 10 MG/ML IV BOLUS
INTRAVENOUS | Status: DC | PRN
  Administered 2017-02-19: 200 mg via INTRAVENOUS

## 2017-02-19 MED ORDER — ONDANSETRON HCL 4 MG/2ML IJ SOLN
INTRAMUSCULAR | Status: DC | PRN
  Administered 2017-02-19: 4 mg via INTRAVENOUS

## 2017-02-19 MED ORDER — MORPHINE SULFATE (PF) 2 MG/ML IV SOLN
2.0000 mg | INTRAVENOUS | Status: DC | PRN
  Administered 2017-02-19: 2 mg via INTRAVENOUS

## 2017-02-19 MED ORDER — SODIUM CHLORIDE 0.9 % IR SOLN
Status: DC | PRN
  Administered 2017-02-19: 3000 mL

## 2017-02-19 MED ORDER — ARTIFICIAL TEARS OPHTHALMIC OINT
TOPICAL_OINTMENT | OPHTHALMIC | Status: DC | PRN
  Administered 2017-02-19: 1 via OPHTHALMIC

## 2017-02-19 MED ORDER — PHENYLEPHRINE HCL 10 MG/ML IJ SOLN
INTRAMUSCULAR | Status: DC | PRN
  Administered 2017-02-19 (×3): 80 ug via INTRAVENOUS

## 2017-02-19 MED ORDER — OXYCODONE HCL 5 MG PO TABS
ORAL_TABLET | ORAL | Status: AC
  Filled 2017-02-19: qty 2

## 2017-02-19 MED ORDER — MIDAZOLAM HCL 5 MG/5ML IJ SOLN
INTRAMUSCULAR | Status: DC | PRN
  Administered 2017-02-19 (×2): 1 mg via INTRAVENOUS

## 2017-02-19 MED ORDER — SUGAMMADEX SODIUM 200 MG/2ML IV SOLN
INTRAVENOUS | Status: AC
  Filled 2017-02-19: qty 2

## 2017-02-19 MED ORDER — FENTANYL CITRATE (PF) 100 MCG/2ML IJ SOLN: 25.0000 ug | INTRAMUSCULAR | Status: DC | PRN

## 2017-02-19 MED ORDER — LIDOCAINE HCL 2 % EX GEL
CUTANEOUS | Status: DC | PRN
  Administered 2017-02-19: 1 via URETHRAL

## 2017-02-19 MED ORDER — SUGAMMADEX SODIUM 200 MG/2ML IV SOLN
INTRAVENOUS | Status: DC | PRN
  Administered 2017-02-19: 140 mg via INTRAVENOUS

## 2017-02-19 MED ORDER — DEXTROSE 5 % IV SOLN
INTRAVENOUS | Status: DC | PRN
  Administered 2017-02-19: 50 ug/min via INTRAVENOUS

## 2017-02-19 MED ORDER — 0.9 % SODIUM CHLORIDE (POUR BTL) OPTIME
TOPICAL | Status: DC | PRN
  Administered 2017-02-19: 2000 mL

## 2017-02-19 MED ORDER — BISACODYL 5 MG PO TBEC
10.0000 mg | DELAYED_RELEASE_TABLET | Freq: Every day | ORAL | Status: DC
  Administered 2017-02-20: 10 mg via ORAL
  Filled 2017-02-19 (×2): qty 2

## 2017-02-19 MED ORDER — POTASSIUM CHLORIDE 10 MEQ/50ML IV SOLN: 10.0000 meq | Freq: Every day | INTRAVENOUS | Status: DC | PRN

## 2017-02-19 MED ORDER — FENTANYL CITRATE (PF) 100 MCG/2ML IJ SOLN
INTRAMUSCULAR | Status: DC | PRN
  Administered 2017-02-19: 150 ug via INTRAVENOUS
  Administered 2017-02-19 (×2): 50 ug via INTRAVENOUS

## 2017-02-19 MED ORDER — LACTATED RINGERS IV SOLN
INTRAVENOUS | Status: DC
  Administered 2017-02-19: 10:00:00 via INTRAVENOUS

## 2017-02-19 MED ORDER — ONDANSETRON HCL 4 MG/2ML IJ SOLN
INTRAMUSCULAR | Status: AC
  Filled 2017-02-19: qty 2

## 2017-02-19 MED ORDER — LIDOCAINE HCL (CARDIAC) 20 MG/ML IV SOLN
INTRAVENOUS | Status: DC | PRN
  Administered 2017-02-19: 80 mg via INTRAVENOUS

## 2017-02-19 MED ORDER — INSULIN ASPART 100 UNIT/ML ~~LOC~~ SOLN: 0.0000 [IU] | Freq: Three times a day (TID) | SUBCUTANEOUS | Status: DC

## 2017-02-19 MED ORDER — FENTANYL CITRATE (PF) 250 MCG/5ML IJ SOLN
INTRAMUSCULAR | Status: AC
  Filled 2017-02-19: qty 5

## 2017-02-19 MED ORDER — ENOXAPARIN SODIUM 40 MG/0.4ML ~~LOC~~ SOLN
40.0000 mg | Freq: Every day | SUBCUTANEOUS | Status: DC
  Administered 2017-02-20 – 2017-02-21 (×2): 40 mg via SUBCUTANEOUS
  Filled 2017-02-19 (×3): qty 0.4

## 2017-02-19 MED ORDER — MIDAZOLAM HCL 2 MG/2ML IJ SOLN
INTRAMUSCULAR | Status: AC
  Filled 2017-02-19: qty 2

## 2017-02-19 MED ORDER — ACETAMINOPHEN 500 MG PO TABS
1000.0000 mg | ORAL_TABLET | Freq: Four times a day (QID) | ORAL | Status: DC
  Administered 2017-02-19 – 2017-02-21 (×8): 1000 mg via ORAL
  Filled 2017-02-19 (×9): qty 2

## 2017-02-19 MED ORDER — OXYCODONE HCL 5 MG PO TABS
5.0000 mg | ORAL_TABLET | ORAL | Status: DC | PRN
  Administered 2017-02-19 – 2017-02-21 (×8): 10 mg via ORAL
  Filled 2017-02-19 (×2): qty 2
  Filled 2017-02-19: qty 1
  Filled 2017-02-19 (×5): qty 2

## 2017-02-19 MED ORDER — DEXAMETHASONE SODIUM PHOSPHATE 10 MG/ML IJ SOLN
INTRAMUSCULAR | Status: AC
  Filled 2017-02-19: qty 1

## 2017-02-19 MED ORDER — ACETAMINOPHEN 160 MG/5ML PO SOLN: 1000.0000 mg | Freq: Four times a day (QID) | ORAL | Status: DC

## 2017-02-19 MED ORDER — MORPHINE SULFATE (PF) 4 MG/ML IV SOLN
INTRAVENOUS | Status: AC
  Filled 2017-02-19: qty 1

## 2017-02-19 MED ORDER — ONDANSETRON HCL 4 MG/2ML IJ SOLN: 4.0000 mg | Freq: Four times a day (QID) | INTRAMUSCULAR | Status: DC | PRN

## 2017-02-19 MED ORDER — DEXTROSE 5 % IV SOLN
1.5000 g | Freq: Two times a day (BID) | INTRAVENOUS | Status: AC
  Administered 2017-02-19 – 2017-02-20 (×2): 1.5 g via INTRAVENOUS
  Filled 2017-02-19 (×2): qty 1.5

## 2017-02-19 MED ORDER — TRAMADOL HCL 50 MG PO TABS
50.0000 mg | ORAL_TABLET | Freq: Four times a day (QID) | ORAL | Status: DC | PRN
  Administered 2017-02-20: 50 mg via ORAL
  Filled 2017-02-19: qty 1

## 2017-02-19 MED ORDER — PROMETHAZINE HCL 25 MG/ML IJ SOLN: 6.2500 mg | INTRAMUSCULAR | Status: DC | PRN

## 2017-02-19 MED ORDER — LACTATED RINGERS IV SOLN: INTRAVENOUS | Status: DC

## 2017-02-19 MED ORDER — ROCURONIUM BROMIDE 100 MG/10ML IV SOLN
INTRAVENOUS | Status: DC | PRN
  Administered 2017-02-19: 60 mg via INTRAVENOUS
  Administered 2017-02-19: 20 mg via INTRAVENOUS

## 2017-02-19 MED ORDER — SENNOSIDES-DOCUSATE SODIUM 8.6-50 MG PO TABS
1.0000 | ORAL_TABLET | Freq: Every day | ORAL | Status: DC
  Filled 2017-02-19: qty 1

## 2017-02-19 MED ORDER — GLYCOPYRROLATE 0.2 MG/ML IJ SOLN: INTRAMUSCULAR | Status: DC | PRN

## 2017-02-19 MED ORDER — DEXTROSE 5 % IV SOLN
INTRAVENOUS | Status: DC | PRN
  Administered 2017-02-19: 1.5 g via INTRAVENOUS

## 2017-02-19 MED ORDER — KCL IN DEXTROSE-NACL 20-5-0.45 MEQ/L-%-% IV SOLN
INTRAVENOUS | Status: DC
  Administered 2017-02-19: 22:00:00 via INTRAVENOUS
  Filled 2017-02-19 (×2): qty 1000

## 2017-02-19 MED ORDER — PROPOFOL 10 MG/ML IV BOLUS
INTRAVENOUS | Status: AC
  Filled 2017-02-19: qty 40

## 2017-02-19 MED ORDER — LACTATED RINGERS IV SOLN
INTRAVENOUS | Status: DC | PRN
  Administered 2017-02-19: 10:00:00 via INTRAVENOUS

## 2017-02-19 MED ORDER — ROCURONIUM BROMIDE 10 MG/ML (PF) SYRINGE
PREFILLED_SYRINGE | INTRAVENOUS | Status: AC
  Filled 2017-02-19: qty 5

## 2017-02-19 MED ORDER — PHENYLEPHRINE 40 MCG/ML (10ML) SYRINGE FOR IV PUSH (FOR BLOOD PRESSURE SUPPORT)
PREFILLED_SYRINGE | INTRAVENOUS | Status: AC
  Filled 2017-02-19: qty 10

## 2017-02-19 MED ORDER — LIDOCAINE 2% (20 MG/ML) 5 ML SYRINGE
INTRAMUSCULAR | Status: AC
  Filled 2017-02-19: qty 5

## 2017-02-19 SURGICAL SUPPLY — 85 items
APPLICATOR TIP COSEAL (VASCULAR PRODUCTS) IMPLANT
APPLICATOR TIP EXT COSEAL (VASCULAR PRODUCTS) IMPLANT
ATTRACTOMAT 16X20 MAGNETIC DRP (DRAPES) ×4 IMPLANT
BAG DRAIN URO-CYSTO SKYTR STRL (DRAIN) ×4 IMPLANT
BLADE SURG 11 STRL SS (BLADE) IMPLANT
CANISTER SUCT 3000ML PPV (MISCELLANEOUS) ×4 IMPLANT
CATH COUDE FOLEY 5CC 14FR (CATHETERS) ×4 IMPLANT
CATH FOLEY 2W COUNCIL 5CC 16FR (CATHETERS) ×4 IMPLANT
CATH FOLEY 2WAY SLVR  5CC 12FR (CATHETERS) ×1
CATH FOLEY 2WAY SLVR 5CC 12FR (CATHETERS) ×3 IMPLANT
CATH KIT ON Q 5IN SLV (PAIN MANAGEMENT) IMPLANT
CATH THORACIC 28FR (CATHETERS) IMPLANT
CATH THORACIC 36FR (CATHETERS) IMPLANT
CATH THORACIC 36FR RT ANG (CATHETERS) IMPLANT
CLEANER TIP ELECTROSURG 2X2 (MISCELLANEOUS) ×4 IMPLANT
CLIP VESOCCLUDE MED 6/CT (CLIP) IMPLANT
CONN ST 1/4X3/8  BEN (MISCELLANEOUS) ×1
CONN ST 1/4X3/8 BEN (MISCELLANEOUS) ×3 IMPLANT
CONT SPEC 4OZ CLIKSEAL STRL BL (MISCELLANEOUS) ×8 IMPLANT
COVER SURGICAL LIGHT HANDLE (MISCELLANEOUS) IMPLANT
DERMABOND ADVANCED (GAUZE/BANDAGES/DRESSINGS) ×1
DERMABOND ADVANCED .7 DNX12 (GAUZE/BANDAGES/DRESSINGS) ×3 IMPLANT
DRAIN CHANNEL 28F RND 3/8 FF (WOUND CARE) ×4 IMPLANT
DRAPE LAPAROSCOPIC ABDOMINAL (DRAPES) ×4 IMPLANT
DRAPE SLUSH/WARMER DISC (DRAPES) ×4 IMPLANT
DRAPE WARM FLUID 44X44 (DRAPE) IMPLANT
DRILL BIT 7/64X5 (BIT) IMPLANT
ELECT BLADE 4.0 EZ CLEAN MEGAD (MISCELLANEOUS) ×4
ELECT BLADE 6.5 EXT (BLADE) ×4 IMPLANT
ELECT CAUTERY BLADE 6.4 (BLADE) ×4 IMPLANT
ELECT REM PT RETURN 9FT ADLT (ELECTROSURGICAL) ×4
ELECTRODE BLDE 4.0 EZ CLN MEGD (MISCELLANEOUS) ×3 IMPLANT
ELECTRODE REM PT RTRN 9FT ADLT (ELECTROSURGICAL) ×3 IMPLANT
FLUID NSS /IRRIG 3000 ML XXX (IV SOLUTION) ×4 IMPLANT
GAUZE SPONGE 4X4 12PLY STRL (GAUZE/BANDAGES/DRESSINGS) ×4 IMPLANT
GAUZE XEROFORM 1X8 LF (GAUZE/BANDAGES/DRESSINGS) ×4 IMPLANT
GLOVE BIO SURGEON STRL SZ 6.5 (GLOVE) ×12 IMPLANT
GOWN STRL REUS W/ TWL LRG LVL3 (GOWN DISPOSABLE) ×9 IMPLANT
GOWN STRL REUS W/TWL LRG LVL3 (GOWN DISPOSABLE) ×3
GUIDEWIRE STR DUAL SENSOR (WIRE) ×4 IMPLANT
KIT BASIN OR (CUSTOM PROCEDURE TRAY) ×4 IMPLANT
KIT ROOM TURNOVER OR (KITS) ×4 IMPLANT
KIT SUCTION CATH 14FR (SUCTIONS) ×4 IMPLANT
NS IRRIG 1000ML POUR BTL (IV SOLUTION) ×8 IMPLANT
PACK CHEST (CUSTOM PROCEDURE TRAY) ×4 IMPLANT
PAD ARMBOARD 7.5X6 YLW CONV (MISCELLANEOUS) ×8 IMPLANT
PASSER SUT SWANSON 36MM LOOP (INSTRUMENTS) IMPLANT
PENCIL BUTTON HOLSTER BLD 10FT (ELECTRODE) ×4 IMPLANT
RELOAD STAPLE TA45 3.5 REG BLU (ENDOMECHANICALS) ×8 IMPLANT
SEALANT PROGEL (MISCELLANEOUS) IMPLANT
SEALANT SURG COSEAL 4ML (VASCULAR PRODUCTS) IMPLANT
SEALANT SURG COSEAL 8ML (VASCULAR PRODUCTS) IMPLANT
SET CYSTO W/LG BORE CLAMP LF (SET/KITS/TRAYS/PACK) ×4 IMPLANT
SOLUTION ANTI FOG 6CC (MISCELLANEOUS) ×4 IMPLANT
STAPLER ENDO NO KNIFE (STAPLE) ×4 IMPLANT
SUT PROLENE 3 0 SH DA (SUTURE) IMPLANT
SUT PROLENE 4 0 RB 1 (SUTURE)
SUT PROLENE 4-0 RB1 .5 CRCL 36 (SUTURE) IMPLANT
SUT SILK  1 MH (SUTURE) ×2
SUT SILK 1 MH (SUTURE) ×6 IMPLANT
SUT SILK 2 0SH CR/8 30 (SUTURE) IMPLANT
SUT SILK 3 0SH CR/8 30 (SUTURE) IMPLANT
SUT VIC AB 1 CTX 18 (SUTURE) IMPLANT
SUT VIC AB 1 CTX 36 (SUTURE)
SUT VIC AB 1 CTX36XBRD ANBCTR (SUTURE) IMPLANT
SUT VIC AB 2-0 CTX 36 (SUTURE) IMPLANT
SUT VIC AB 2-0 UR6 27 (SUTURE) ×8 IMPLANT
SUT VIC AB 3-0 X1 27 (SUTURE) ×8 IMPLANT
SUT VICRYL 2 TP 1 (SUTURE) IMPLANT
SWAB COLLECTION DEVICE MRSA (MISCELLANEOUS) IMPLANT
SWAB CULTURE ESWAB REG 1ML (MISCELLANEOUS) IMPLANT
SYRINGE TOOMEY DISP (SYRINGE) ×4 IMPLANT
SYSTEM SAHARA CHEST DRAIN ATS (WOUND CARE) ×4 IMPLANT
SYSTEM SAHARA CHEST DRAIN RE-I (WOUND CARE) ×4 IMPLANT
TAPE CLOTH 4X10 WHT NS (GAUZE/BANDAGES/DRESSINGS) ×4 IMPLANT
TIP APPLICATOR SPRAY EXTEND 16 (VASCULAR PRODUCTS) IMPLANT
TOWEL OR 17X24 6PK STRL BLUE (TOWEL DISPOSABLE) ×4 IMPLANT
TOWEL OR 17X26 10 PK STRL BLUE (TOWEL DISPOSABLE) ×4 IMPLANT
TRAP SPECIMEN MUCOUS 40CC (MISCELLANEOUS) IMPLANT
TRAY FOLEY CATH SILVER 16FR LF (SET/KITS/TRAYS/PACK) ×4 IMPLANT
TRAY FOLEY SILVER 14FR TEMP (SET/KITS/TRAYS/PACK) ×4 IMPLANT
TROCAR BLADELESS 12MM (ENDOMECHANICALS) IMPLANT
TUBE CONNECTING 20X1/4 (TUBING) ×4 IMPLANT
WATER STERILE IRR 1000ML POUR (IV SOLUTION) ×8 IMPLANT
YANKAUER SUCT BULB TIP NO VENT (SUCTIONS) ×4 IMPLANT

## 2017-02-19 NOTE — Transfer of Care (Signed)
Immediate Anesthesia Transfer of Care Note  Patient: [name]  Procedure(s) Performed: Procedure(s): RIGHT VIDEO ASSISTED THORACOSCOPY STAPLING OF APICAL BLEB (Right) CYSTOSCOPY (N/A)  Patient Location: PACU  Anesthesia Type:General  Level of Consciousness: awake, alert  and pateint uncooperative  Airway & Oxygen Therapy: Patient Spontanous Breathing and Patient connected to nasal cannula oxygen  Post-op Assessment: Report given to RN, Post -op Vital signs reviewed and stable and Patient moving all extremities X 4  Post vital signs: Reviewed and stable  Last Vitals:  Vitals:   02/18/17 2124 02/19/17 0523  BP: 123/84 123/67  Pulse: 76 69  Resp: 18 18  Temp: 37.2 C 37.6 C  SpO2: 99% 100%    Last Pain:  Vitals:   02/19/17 0824  TempSrc:   PainSc: 3       Patients Stated Pain Goal: 2 (02/19/17 0700)  Complications: No apparent anesthesia complications

## 2017-02-19 NOTE — Progress Notes (Addendum)
Patient transported to OR, short stay called and given report, patient notified family he was leaving, pre-op checklist complete. Chest tube taken off suction for transport. Notified short stay nurse the antibiotic that is ordered we don't have up here and she said she will take care of it.

## 2017-02-19 NOTE — Anesthesia Procedure Notes (Signed)
Procedure Name: Intubation Date/Time: 02/19/2017 10:47 AM Performed by: Edmonia CaprioAUSTON, Shanyiah Conde M Pre-anesthesia Checklist: Patient identified, Emergency Drugs available, Suction available, Patient being monitored and Timeout performed Patient Re-evaluated:Patient Re-evaluated prior to induction Oxygen Delivery Method: Circle system utilized Preoxygenation: Pre-oxygenation with 100% oxygen Induction Type: IV induction Ventilation: Mask ventilation without difficulty Laryngoscope Size: Miller and 2 Grade View: Grade I Tube type: Oral Endobronchial tube: Left and Double lumen EBT and 41 Fr Number of attempts: 2 Airway Equipment and Method: Stylet and Fiberoptic brochoscope Placement Confirmation: ETT inserted through vocal cords under direct vision,  positive ETCO2 and breath sounds checked- equal and bilateral Tube secured with: Tape Dental Injury: Teeth and Oropharynx as per pre-operative assessment  Comments: DL x1 Gr 1 view.  ETT positioned at entrance to vocal cords and popped off to espohagus with removal of stylet- immediately recognized.  DL x2  With successful DLT placement and confirmation of appropriate position by FOB.

## 2017-02-19 NOTE — Progress Notes (Signed)
Patient transferred from PACU to 2C10.  Per Diplomatic Services operational officersecretary, staff on 6N will return his belongings from [phone].

## 2017-02-19 NOTE — Progress Notes (Signed)
Patient ID: [name], male   DOB: 22-Oct-1980, 36 y.o.   MRN: [id number] EVENING ROUNDS NOTE :     [address]       [place] [id number]             [phone]                 Day of Surgery Procedure(s) (LRB): RIGHT VIDEO ASSISTED THORACOSCOPY STAPLING OF APICAL BLEB (Right) CYSTOSCOPY (N/A)  Total Length of Stay:  LOS: 8 days  BP (!) 146/84 (BP Location: Left Arm)   Pulse 95   Temp (!) 101 F (38.3 C) (Oral)   Resp (!) 21   Ht 5\' 11"  (1.803 m)   Wt 151 lb 6.4 oz (68.7 kg)   SpO2 96%   BMI 21.12 kg/m   .Intake/Output      08/13 0701 - 08/14 0700   P.O. 960   I.V. (mL/kg) 900 (13.1)   Total Intake(mL/kg) 1860 (27.1)   Urine (mL/kg/hr) 775 (0.8)   Blood 50   Chest Tube 135   Total Output 960   Net +900         . cefUROXime (ZINACEF)  IV    . dextrose 5 % and 0.45 % NaCl with KCl 20 mEq/L    . potassium chloride       Lab Results  Component Value Date   WBC 8.4 02/19/2017   HGB 11.6 (L) 02/19/2017   HCT 34.8 (L) 02/19/2017   PLT 289 02/19/2017   GLUCOSE 86 02/19/2017   ALT 10 (L) 02/13/2017   AST 18 02/13/2017   NA 141 02/19/2017   K 3.7 02/19/2017   CL 102 02/19/2017   CREATININE 0.91 02/19/2017   BUN 6 02/19/2017   CO2 31 02/19/2017   TSH 1.323 02/13/2017   Stable post op no air leak D/c a line  With urethral stricture dilation urology requested foley stay 48 hours   Delight OvensEdward B Kambra Beachem MD  Beeper [phone] Office [phone] 02/19/2017 9:24 PM

## 2017-02-19 NOTE — Progress Notes (Signed)
PROGRESS NOTE    [name]  ZOX:[id number] DOB: Dec 27, 1980 DOA: 02/11/2017 PCP: Patient, No Pcp Per   Brief Narrative:  36 y.o.BM PMHx Tobacco abuse, Marijuana/Cocaine abuse, transferred to [facility] from [facility]  with a large Right Acute Pneumothorax.  He had a cold the preceding week and had been doing "a lot of hard coughing". The day of his admission he awakened to sudden onset chest pain.  In the ER a chest tube was placed, and sedation had to be administered after the pt became combative and belligerent.     Subjective: Afebrile, no SOB, good O2 sat on RA. Patient continue to have pleuritic chest pain and there is presence of ongoing air leakage.  Assessment & Plan:   Principal Problem:   Pneumothorax, acute Active Problems:   Chest pain   Hypokalemia   Acute kidney injury [facility])   Drug use   Tobacco use disorder   Requires restraints for behavioral reasons  Spontaneous Right pneumothorax -Right chest tube to waterseal per PCCM on 8/7; good lung re-expansion, but continue to have air leakage. Planning VATS by CVTS later today (see below) -continue PRN analgesics -Patient last CXR demonstrated stable small pneumothorax. Still with positive air leak. -Chest tube to remain to suction   -continue IS  Acute kidney injury Lab Results  Component Value Date   CREATININE 0.91 02/19/2017   CREATININE 0.96 02/16/2017   CREATININE 1.24 02/13/2017  -Resolved with hydration and with last Cr of 0.91 on 8/13 -recommend intermittent follow up of his renal function   Microcytic anemia -Most likely secondary to polysubstance abuse and poor nutrition -Anemia panel shows low iron, FE./TIBC ratio c/w iron deficiency anemia.  -continue ferrous sulfate and vit C   -last Hgb level 11.6 -follow trend intermittently   Hypokalemia -repleted  -electrolytes remains normal   Polysubstance abuse -UDS was positive for cocaine/marijuana on admission -cessation counseling  provided -patient is not ready to quit and doesn't believe his recreational habits had anything to do with his current problem.   DVT prophylaxis: Lovenox Code Status: Full Family Communication: None Disposition Plan: remains inpatient; patient will go for VATS later today. Anticipate care to be transferred to CVTS post-operatively. Available to further assist in his care if needed.    Consultants:  PCCM CVTS  Procedures/Significant Events:  8/5 Chest Tube; placed in ED 8/13 VATS   LINES / TUBES:  8/5 Chest Tube>>   . [MAR Hold] cefUROXime (ZINACEF)  IV    . lactated ringers 10 mL/hr at 02/19/17 1003  . lactated ringers    Objective: Vitals:   02/18/17 0605 02/18/17 1455 02/18/17 2124 02/19/17 0523  BP: 116/68 (!) 134/93 123/84 123/67  Pulse: 72 73 76 69  Resp: 18 18 18 18   Temp: 98.1 F (36.7 C) 99.6 F (37.6 C) 99 F (37.2 C) 99.7 F (37.6 C)  TempSrc: Oral Oral Oral Oral  SpO2: 98% 100% 99% 100%  Weight:      Height:        Intake/Output Summary (Last 24 hours) at 02/19/17 1057 Last data filed at 02/19/17 0525  Gross per 24 hour  Intake              120 ml  Output               30 ml  Net               90 ml   Filed Weights   02/11/17 0900  Weight:  68.7 kg (151 lb 6.4 oz)    Examination: General: AAOX3, in no major distress. Continue to have pleuritic chest pain.  good O2 sat on RA and looking for surgery today.  Physical exam unchanged from 02/18/17. Please see below for details.  Lungs: no wheezing, no crackles. Normal resp effort  Cardiovascular: RRR, no murmurs, no rubs, no gallops   Abdomen: soft, NT, ND, positive BS Extremities: no cyanosis, no clubbing Skin: multiple tattoos appreciated, chest tube in right costal area; no rash, no petechiae.   Data Reviewed: Care during the described time interval was provided by me .  I have reviewed this patient's available data, including medical history, events of note, physical examination, and all test  results as part of my evaluation. I have personally reviewed and interpreted all radiology studies.  CBC:  Recent Labs Lab 02/13/17 0242 02/16/17 0251 02/19/17 0323  WBC 4.8 5.2 8.4  HGB 10.9* 12.5* 11.6*  HCT 32.8* 38.0* 34.8*  MCV 70.2* 71.2* 70.2*  PLT 170 220 289   Basic Metabolic Panel:  Recent Labs Lab 02/13/17 0242 02/16/17 0251 02/19/17 0323  NA 141 143 141  K 3.6 4.2 3.7  CL 107 104 102  CO2 29 30 31   GLUCOSE 105* 91 86  BUN 5* 7 6  CREATININE 1.24 0.96 0.91  CALCIUM 8.3* 9.1 8.8*   GFR: Estimated Creatinine Clearance: 111.1 mL/min (by C-G formula based on SCr of 0.91 mg/dL).   Liver Function Tests:  Recent Labs Lab 02/13/17 0242  AST 18  ALT 10*  ALKPHOS 38  BILITOT 0.4  PROT 5.3*  ALBUMIN 3.1*   Cardiac Enzymes: No results for input(s): CKTOTAL, CKMB, CKMBINDEX, TROPONINI in the last 168 hours.  Recent Results (from the past 240 hour(s))  MRSA PCR Screening     Status: None   Collection Time: 02/11/17  3:47 PM  Result Value Ref Range Status   MRSA by PCR NEGATIVE NEGATIVE Final    Comment:        The GeneXpert MRSA Assay (FDA approved for NASAL specimens only), is one component of a comprehensive MRSA colonization surveillance program. It is not intended to diagnose MRSA infection nor to guide or monitor treatment for MRSA infections.      Radiology Studies: Dg Chest Port 1v Same Day  Result Date: 02/18/2017 CLINICAL DATA:  Follow-up pneumothorax EXAM: PORTABLE CHEST 1 VIEW COMPARISON:  02/17/2017 FINDINGS: Tiny residual right apical pneumothorax is noted and stable. No new focal abnormality is seen. Cardiac shadow is within normal limits. Right thoracostomy catheter is again identified. No bony abnormality is seen. IMPRESSION: Stable tiny right residual pneumothorax Electronically Signed   By: Alcide Clever M.D.   On: 02/18/2017 07:07   Dg Chest Port 1v Same Day  Result Date: 02/17/2017 CLINICAL DATA:  Evaluate pneumothorax EXAM:  PORTABLE CHEST 1 VIEW COMPARISON:  02/16/2017 FINDINGS: Cardiac shadow is within normal limits. Right chest tube is again seen. Tiny apical right pneumothorax is noted slightly smaller than that seen on prior exam. Left lung remains clear. No bony abnormality noted. IMPRESSION: Tiny residual right pneumothorax improved from the prior exam. Electronically Signed   By: Alcide Clever M.D.   On: 02/17/2017 11:05    Scheduled Meds: . [MAR Hold] enoxaparin (LOVENOX) injection  40 mg Subcutaneous Q24H  . [MAR Hold] ferrous sulfate  325 mg Oral TID WC  . [MAR Hold] ibuprofen  600 mg Oral TID  . [MAR Hold] vitamin C  250 mg Oral TID  Continuous Infusions: . [MAR Hold] cefUROXime (ZINACEF)  IV    . lactated ringers 10 mL/hr at 02/19/17 1003  . lactated ringers       LOS: 8 days    Time spent: 15 minutes    Vassie LollMadera, Lowen Barringer, MD [facility] Pager [phone]  If 7PM-7AM, please contact night-coverage [email or web address] Password [email or web address] 02/19/2017, 10:57 AM

## 2017-02-19 NOTE — Op Note (Signed)
NAMBenay Spice:  Swiney, Ronney                 ACCOUNT NO.:  [id number]  MEDICAL RECORD NO.:  [id number]  LOCATION:  [id number]                         FACILITY:  [facility]  PHYSICIAN:  Excell SeltzerJohn J. Annabell HowellsWrenn, M.D.    DATE OF BIRTH:  [birth year]  DATE OF PROCEDURE:  02/19/2017 DATE OF DISCHARGE:                              OPERATIVE REPORT   PROCEDURE:  Cystoscopy with urethral dilation, insertion of complicated Foley.  PREOPERATIVE DIAGNOSIS:  Difficult Foley placement.  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  SURGEON:  Excell SeltzerJohn J. Annabell HowellsWrenn, M.D.  ANESTHESIA:  General.  SPECIMEN:  None.  DRAINS:  A 16-French Council catheter.  BLOOD LOSS:  None.  COMPLICATIONS:  None.  INDICATIONS:  Mr. Michail JewelsMarsh is a 36 year old African American male who is to undergo a VATS procedure for pneumothorax per Dr. Tyrone SageGerhardt.  Attempted Foley placement at the beginning of procedure was unsuccessful by multiple individuals.  I was asked to see the patient.  FINDINGS AND PROCEDURE:  He was in the operating room and had been rolled from lateral decubitus to supine.  He had some blood at the meatus.  His genitalia were prepped with Betadine solution.  He was draped with sterile towels.  Cystoscopy was performed using the 16-French flexible scope. Examination revealed a normal distal meatus and distal urethra but at approximately 3-4 cm proximal meatus, there was an obvious false passage adjacent to the true lumen with moderate stricturing.  A guidewire was passed through the cystoscope into the bladder through the true lumen.  The urethra was then dilated to 24-French with Geraldo PitterGoodwin sounds and a 16-French Foley catheter was inserted without difficulty. The balloon was filled with 10 mL of sterile fluid, and it was pulled back to the bladder neck.  The wire was removed and slightly bloody urine was aspirated from the bladder.  Because I did not want to risk losing access, I did not cysto  beyond the stricture.  I notified Dr. Tyrone SageGerhardt of the findings and the need to keep the catheter for 48 hours.  The patient will need follow up in our office in approximately 2 weeks.  There were no complications.     Excell SeltzerJohn J. Annabell HowellsWrenn, M.D.     JJW/MEDQ  D:  02/19/2017  T:  02/19/2017  Job:  [id number]

## 2017-02-19 NOTE — Brief Op Note (Addendum)
      [address]       [place] [id number]             [phone]      02/19/2017  12:40 PM  PATIENT:  [name]  36 y.o. male  PRE-OPERATIVE DIAGNOSIS:  RIGHT CHEST TUBE- AIRLEAK  POST-OPERATIVE DIAGNOSIS:  RIGHT CHEST TUBE- AIRLEAK apical bleb  PROCEDURE:  Procedure(s): VIDEO ASSISTED THORACOSCOPY (Right) BLEBECTOMY (Right) PLEURAL ABRASION (Right)  SURGEON:  Surgeon(s) and Role:    Delight Ovens* Jordany Russett B, MD - Primary   ANESTHESIA:   general  EBL:  No intake/output data recorded.  BLOOD ADMINISTERED:none  DRAINS: right chest tube #28    SPECIMEN:  No Specimen  DISPOSITION OF SPECIMEN:  N/A  COUNTS:  YES   DICTATION: .Dragon Dictation  PLAN OF CARE: post op 4e with chest tube   PATIENT DISPOSITION:  patient is in patient    Delay start of Pharmacological VTE agent (>24hrs) due to surgical blood loss or risk of bleeding: yes  Complication: unable to pass foley, Dr Wilson SingerWren consulted intra op and placed foley

## 2017-02-19 NOTE — Anesthesia Preprocedure Evaluation (Addendum)
Anesthesia Evaluation  Patient identified by MRN, date of birth, ID band Patient awake    Reviewed: Allergy & Precautions, NPO status , Patient's Chart, lab work & pertinent test results  Airway Mallampati: II  TM Distance: >3 FB Neck ROM: Full    Dental no notable dental hx. (+) Dental Advisory Given   Pulmonary Current Smoker,  Pneumothorax   Pulmonary exam normal breath sounds clear to auscultation       Cardiovascular negative cardio ROS Normal cardiovascular exam Rhythm:Regular Rate:Normal     Neuro/Psych negative neurological ROS  negative psych ROS   GI/Hepatic negative GI ROS, Neg liver ROS,   Endo/Other  negative endocrine ROS  Renal/GU negative Renal ROS  negative genitourinary   Musculoskeletal negative musculoskeletal ROS (+)   Abdominal   Peds  Hematology negative hematology ROS (+)   Anesthesia Other Findings Marijuana use  Reproductive/Obstetrics                             Anesthesia Physical Anesthesia Plan  ASA: II  Anesthesia Plan: General   Post-op Pain Management:    Induction: Intravenous  PONV Risk Score and Plan: 1 and Ondansetron, Dexamethasone and Treatment may vary due to age or medical condition  Airway Management Planned: Double Lumen EBT  Additional Equipment: Arterial line  Intra-op Plan:   Post-operative Plan: Extubation in OR  Informed Consent: I have reviewed the patients History and Physical, chart, labs and discussed the procedure including the risks, benefits and alternatives for the proposed anesthesia with the patient or authorized representative who has indicated his/her understanding and acceptance.   Dental advisory given  Plan Discussed with: CRNA  Anesthesia Plan Comments:        Anesthesia Quick Evaluation

## 2017-02-19 NOTE — OR Nursing (Signed)
1050 Numerous attempts made to place indwelling foley cath per OR nurses and Dr. Tyrone SageGerhardt. Consult placed with Urology d/t inability to insert foley.

## 2017-02-19 NOTE — Progress Notes (Signed)
Pre Procedure note for inpatients:   [name] has been scheduled for Procedure(s): VIDEO ASSISTED THORACOSCOPY (Right) BLEBECTOMY (Right) PLEURAL ABRASION (Right) today. The various methods of treatment have been discussed with the patient. After consideration of the risks, benefits and treatment options the patient has consented to the planned procedure.   The patient has been seen and labs reviewed. There are no changes in the patient's condition to prevent proceeding with the planned procedure today.  Recent labs:  Lab Results  Component Value Date   WBC 8.4 02/19/2017   HGB 11.6 (L) 02/19/2017   HCT 34.8 (L) 02/19/2017   PLT 289 02/19/2017   GLUCOSE 86 02/19/2017   ALT 10 (L) 02/13/2017   AST 18 02/13/2017   NA 141 02/19/2017   K 3.7 02/19/2017   CL 102 02/19/2017   CREATININE 0.91 02/19/2017   BUN 6 02/19/2017   CO2 31 02/19/2017   TSH 1.323 02/13/2017    The goals risks and alternatives of the planned surgical procedure Procedure(s): VIDEO ASSISTED THORACOSCOPY (Right) BLEBECTOMY (Right) PLEURAL ABRASION (Right)  have been discussed with the patient in detail. The risks of the procedure including death, infection, stroke, myocardial infarction, bleeding, blood transfusion have all been discussed specifically.  I have quoted Roger Chimeavon Pugh a 1% of perioperative mortality and a complication rate as high as 10%. The patient's questions have been answered.[name] is willing  to proceed with the planned procedure.  Delight OvensEdward B Romell Wolden, MD 02/19/2017 10:22 AM

## 2017-02-19 NOTE — Anesthesia Procedure Notes (Signed)
Arterial Line Insertion Start/End8/13/2018 10:20 AM, 02/19/2017 10:30 AM Performed by: Quentin OreWALKER, LUZ E, CRNA  Patient location: Pre-op. Preanesthetic checklist: patient identified, risks and benefits discussed, surgical consent, monitors and equipment checked and pre-op evaluation Lidocaine 1% used for infiltration and patient sedated Right, radial was placed Catheter size: 20 G Hand hygiene performed  and maximum sterile barriers used   Attempts: 1 Procedure performed without using ultrasound guided technique. Following insertion, dressing applied. Post procedure assessment: normal  Patient tolerated the procedure well with no immediate complications.

## 2017-02-19 NOTE — Anesthesia Postprocedure Evaluation (Signed)
Anesthesia Post Note  Patient: Aneesh Xxxmarsh  Procedure(s) Performed: Procedure(s) (LRB): RIGHT VIDEO ASSISTED THORACOSCOPY STAPLING OF APICAL BLEB (Right) CYSTOSCOPY (N/A)     Patient location during evaluation: PACU Anesthesia Type: General Level of consciousness: awake and alert Pain management: pain level controlled Vital Signs Assessment: post-procedure vital signs reviewed and stable Respiratory status: spontaneous breathing, nonlabored ventilation, respiratory function stable and patient connected to nasal cannula oxygen Cardiovascular status: blood pressure returned to baseline and stable Postop Assessment: no signs of nausea or vomiting Anesthetic complications: no    Last Vitals:  Vitals:   02/19/17 1344 02/19/17 1359  BP: (!) 144/92 138/87  Pulse: 84 78  Resp: 19 18  Temp:    SpO2: 95% 94%    Last Pain:  Vitals:   02/19/17 1400  TempSrc:   PainSc: Asleep                 Beryle Lathehomas E Robin Petrakis

## 2017-02-20 ENCOUNTER — Inpatient Hospital Stay (HOSPITAL_COMMUNITY): Payer: Self-pay

## 2017-02-20 ENCOUNTER — Encounter (HOSPITAL_COMMUNITY): Payer: Self-pay | Admitting: Cardiothoracic Surgery

## 2017-02-20 DIAGNOSIS — R338 Other retention of urine: Secondary | ICD-10-CM

## 2017-02-20 LAB — BASIC METABOLIC PANEL
Anion gap: 6 (ref 5–15)
BUN: <5 mg/dL — ABNORMAL LOW (ref 6–20)
CO2: 32 mmol/L (ref 22–32)
Calcium: 8.5 mg/dL — ABNORMAL LOW (ref 8.9–10.3)
Chloride: 101 mmol/L (ref 101–111)
Creatinine, Ser: 0.99 mg/dL (ref 0.61–1.24)
GFR calc Af Amer: >60 mL/min (ref >60)
GFR calc non Af Amer: >60 mL/min (ref >60)
Glucose, Bld: 115 mg/dL — ABNORMAL HIGH (ref 65–99)
Potassium: 4.1 mmol/L (ref 3.5–5.1)
Sodium: 139 mmol/L (ref 135–145)

## 2017-02-20 LAB — CBC
HCT: 33.8 % — ABNORMAL LOW (ref 39.0–52.0)
Hemoglobin: 11.2 g/dL — ABNORMAL LOW (ref 13.0–17.0)
MCH: 23.5 pg — ABNORMAL LOW (ref 26.0–34.0)
MCHC: 33.1 g/dL (ref 30.0–36.0)
MCV: 70.9 fL — ABNORMAL LOW (ref 78.0–100.0)
Platelets: 275 10*3/uL (ref 150–400)
RBC: 4.77 MIL/uL (ref 4.22–5.81)
RDW: 13.6 % (ref 11.5–15.5)
WBC: 12.7 10*3/uL — ABNORMAL HIGH (ref 4.0–10.5)

## 2017-02-20 NOTE — Progress Notes (Signed)
Patient ID: [name], male   DOB: 02/13/1981, 36 y.o.   MRN: [id number] TCTS DAILY ICU PROGRESS NOTE                   [address]            [place] [id number]          [id number]   1 Day Post-Op Procedure(s) (LRB): RIGHT VIDEO ASSISTED THORACOSCOPY STAPLING OF APICAL BLEB (Right) CYSTOSCOPY (N/A)  Total Length of Stay:  LOS: 9 days   Subjective: Stable night   Objective: Vital signs in last 24 hours: Temp:  [98 F (36.7 C)-101 F (38.3 C)] 100.7 F (38.2 C) (08/14 0343) Pulse Rate:  [72-103] 85 (08/14 0343) Cardiac Rhythm: Normal sinus rhythm (08/14 0343) Resp:  [16-26] 21 (08/14 0343) BP: (116-147)/(76-97) 127/84 (08/14 0343) SpO2:  [94 %-98 %] 96 % (08/14 0343) Arterial Line BP: (132-184)/(58-86) 170/79 (08/13 2018)  Filed Weights   02/11/17 0900  Weight: 151 lb 6.4 oz (68.7 kg)    Weight change:    Hemodynamic parameters for last 24 hours:    Intake/Output from previous day: 08/13 0701 - 08/14 0700 In: 2573.8 [P.O.:1200; I.V.:1323.8; IV Piggyback:50] Out: 4650 [Urine:4350; Blood:50; Chest Tube:250]  Intake/Output this shift: No intake/output data recorded.  Current Meds: Scheduled Meds: . acetaminophen  1,000 mg Oral Q6H   Or  . acetaminophen (TYLENOL) oral liquid 160 mg/5 mL  1,000 mg Oral Q6H  . bisacodyl  10 mg Oral Daily  . enoxaparin (LOVENOX) injection  40 mg Subcutaneous Daily  . ferrous sulfate  325 mg Oral TID WC  . insulin aspart  0-24 Units Subcutaneous TID AC & HS  . senna-docusate  1 tablet Oral QHS  . vitamin C  250 mg Oral TID   Continuous Infusions: . cefUROXime (ZINACEF)  IV Stopped (02/19/17 2301)  . dextrose 5 % and 0.45 % NaCl with KCl 20 mEq/L 75 mL/hr at 02/19/17 2221  . potassium chloride     PRN Meds:.ondansetron (ZOFRAN) IV, ondansetron **OR** [DISCONTINUED] ondansetron (ZOFRAN) IV, oxyCODONE, potassium chloride, senna-docusate, traMADol, traZODone  General appearance: alert and  cooperative Neurologic: intact Heart: regular rate and rhythm, S1, S2 normal, no murmur, click, rub or gallop Lungs: clear to auscultation bilaterally Abdomen: soft, non-tender; bowel sounds normal; no masses,  no organomegaly Extremities: extremities normal, atraumatic, no cyanosis or edema and Homans sign is negative, no sign of DVT Wound: no air leak  Lab Results: CBC: Recent Labs  02/19/17 0323 02/20/17 0359  WBC 8.4 12.7*  HGB 11.6* 11.2*  HCT 34.8* 33.8*  PLT 289 275   BMET:  Recent Labs  02/19/17 0323 02/20/17 0359  NA 141 139  K 3.7 4.1  CL 102 101  CO2 31 32  GLUCOSE 86 115*  BUN 6 <5*  CREATININE 0.91 0.99  CALCIUM 8.8* 8.5*    CMET: Lab Results  Component Value Date   WBC 12.7 (H) 02/20/2017   HGB 11.2 (L) 02/20/2017   HCT 33.8 (L) 02/20/2017   PLT 275 02/20/2017   GLUCOSE 115 (H) 02/20/2017   ALT 10 (L) 02/13/2017   AST 18 02/13/2017   NA 139 02/20/2017   K 4.1 02/20/2017   CL 101 02/20/2017   CREATININE 0.99 02/20/2017   BUN <5 (L) 02/20/2017   CO2 32 02/20/2017   TSH 1.323 02/13/2017      PT/INR: No results for input(s): LABPROT, INR in the last 72 hours. Radiology: Dg Chest  Port 1 View  Result Date: 02/19/2017 CLINICAL DATA:  Status post video-assisted thoracic surgery EXAM: PORTABLE CHEST 1 VIEW COMPARISON:  Portable chest x-ray of February 18, 2017 FINDINGS: The right-sided chest tube is been repositioned such that the tip now lies in the medial aspect of the apex. No significant right-sided pneumothorax is observed. There is a small amount of subcutaneous emphysema in the lower and mid axillary region on the right. The left lung is well-expanded and clear. The heart and pulmonary vascularity are normal. The bony thorax exhibits no acute abnormality. IMPRESSION: The tiny right apical pneumothorax is not evident on this study. The chest tube is been repositioned since it its tip now lies in the medial aspect of the right pulmonary apex.  Electronically Signed   By: David  [place] M.D.   On: 02/19/2017 14:01     Assessment/Plan: S/P Procedure(s) (LRB): RIGHT VIDEO ASSISTED THORACOSCOPY STAPLING OF APICAL BLEB (Right) CYSTOSCOPY (N/A) Mobilize Continue foley due to bladder outlet obstruction Plan for transfer to step-down: see transfer orders Ct to water seal Likely chest tube and foley out tomorrow then consider d/c home     Delight Ovens 02/20/2017 7:41 AM

## 2017-02-20 NOTE — Progress Notes (Signed)
PROGRESS NOTE    [name]  MVH:[id number] DOB: 02-20-1981 DOA: 02/11/2017 PCP: Patient, No Pcp Per   Brief Narrative:  36 y.o.BM PMHx Tobacco abuse, Marijuana/Cocaine abuse, transferred to [facility] from [facility]  with a large Right Acute Pneumothorax.  He had a cold the preceding week and had been doing "a lot of hard coughing". The day of his admission he awakened to sudden onset chest pain.  In the ER a chest tube was placed, and sedation had to be administered after the pt became combative and belligerent.     Subjective: Afebrile, no CP, no SOB, no nausea, no vomiting. Tolerated well VATS procedure and is dying to have chest tube and foley catheter removed.   Assessment & Plan:   Principal Problem:   Pneumothorax, acute Active Problems:   Chest pain   Hypokalemia   Acute kidney injury [facility])   Drug use   Tobacco use disorder   Requires restraints for behavioral reasons  Spontaneous Right pneumothorax -Right chest tube to waterseal per PCCM on 8/7; good lung re-expansion, but continue to have air leakage.  -Patient s/p VATS with stapling of apical bleb -Chest tube transition to water seal as per CVTS rec's; most likely out tomorrow and then home -no air leakage appreciated  -continue PRN analgesics  Acute kidney injury: patient creatinine elevated and peaked at 1.34 on admission  Lab Results  Component Value Date   CREATININE 0.99 02/20/2017   CREATININE 0.91 02/19/2017   CREATININE 0.96 02/16/2017  -Resolved with hydration and with last Cr of 0.99 on 8/14 -recommend intermittent follow up of his renal function  -maintain adequate hydration   Microcytic anemia -Most likely secondary to polysubstance abuse and poor nutrition -Anemia panel shows low iron, FE./TIBC ratio c/w iron deficiency anemia.  -will continue ferrous sulfate and vit C   -last Hgb level 11.2 -follow trend intermittently   Hypokalemia -repleted  -electrolytes remains normal   Polysubstance  abuse -UDS was positive for cocaine/marijuana on admission -once again cessation counseling provided -patient is not ready to quit and doesn't believe his recreational habits had anything to do with his current problem.  Leukocytosis -after surgery -most liekly stress demargination -will monitor trend   Urinary retention -due to urethral spasm -urology place coudette catheter and recommend 48 hours with foley  -attempt voiding trial off tomorrow   DVT prophylaxis: Lovenox Code Status: Full Family Communication: None Disposition Plan: remains inpatient; patient s/p VATS 02/19/17, well tolerated and with CT transitioned to water seal. Will transfer to med-surg.    Consultants:  PCCM CVTS  Procedures/Significant Events:  8/5 Chest Tube; placed in ED 8/13 VATS   LINES / TUBES:  8/5 Chest Tube>>   . dextrose 5 % and 0.45 % NaCl with KCl 20 mEq/L 10 mL/hr at 02/20/17 0741  . potassium chloride    Objective: Vitals:   02/20/17 0343 02/20/17 0745 02/20/17 1100 02/20/17 1537  BP: 127/84 131/87 115/74 118/80  Pulse: 85 89 81   Resp: (!) 21 19 18    Temp: (!) 100.7 F (38.2 C) 98.5 F (36.9 C) 99.7 F (37.6 C) 99.2 F (37.3 C)  TempSrc: Oral Oral Oral Oral  SpO2: 96% 95% 97%   Weight:      Height:        Intake/Output Summary (Last 24 hours) at 02/20/17 1725 Last data filed at 02/20/17 1539  Gross per 24 hour  Intake             1950  ml  Output             5175 ml  Net            -3225 ml   Filed Weights   02/11/17 0900  Weight: 68.7 kg (151 lb 6.4 oz)    Examination: General: AAOX3, reporting come chest discomfort where chest tube is placed. no resp distress. No nausea, no vomiting and no abd pain. Patient will like to have foley removed. Lungs: no wheezing, good air movement bilaterally. Normal resp effort  Cardiovascular: RRR, no murmurs, no gallops, no JVD, no rubs   Abdomen: soft, NT, ND, positive BS Extremities: no cyanosis or clubbing Skin: multiple  tattoos seen, positive right side chest tube, no rash and no petechiae.    Data Reviewed: Care during the described time interval was provided by me .  I have reviewed this patient's available data, including medical history, events of note, physical examination, and all test results as part of my evaluation. I have personally reviewed and interpreted all radiology studies.  CBC:  Recent Labs Lab 02/16/17 0251 02/19/17 0323 02/20/17 0359  WBC 5.2 8.4 12.7*  HGB 12.5* 11.6* 11.2*  HCT 38.0* 34.8* 33.8*  MCV 71.2* 70.2* 70.9*  PLT 220 289 275   Basic Metabolic Panel:  Recent Labs Lab 02/16/17 0251 02/19/17 0323 02/20/17 0359  NA 143 141 139  K 4.2 3.7 4.1  CL 104 102 101  CO2 30 31 32  GLUCOSE 91 86 115*  BUN 7 6 <5*  CREATININE 0.96 0.91 0.99  CALCIUM 9.1 8.8* 8.5*   GFR: Estimated Creatinine Clearance: 102.2 mL/min (by C-G formula based on SCr of 0.99 mg/dL).    Recent Results (from the past 240 hour(s))  MRSA PCR Screening     Status: None   Collection Time: 02/11/17  3:47 PM  Result Value Ref Range Status   MRSA by PCR NEGATIVE NEGATIVE Final    Comment:        The GeneXpert MRSA Assay (FDA approved for NASAL specimens only), is one component of a comprehensive MRSA colonization surveillance program. It is not intended to diagnose MRSA infection nor to guide or monitor treatment for MRSA infections.      Radiology Studies: Dg Chest Port 1 View  Result Date: 02/20/2017 CLINICAL DATA:  Evaluate chest tube position . EXAM: PORTABLE CHEST 1 VIEW COMPARISON:  02/19/2017. FINDINGS: Right chest tube in stable position. No pneumothorax. Stable mild right chest wall subcutaneous emphysema. No focal infiltrate. Stable cardiomegaly. Vascular stent noted over the right subclavian region and stable position. IMPRESSION: Stable position of right chest tube.  No pneumothorax. Electronically Signed   By: Maisie Fus  Register   On: 02/20/2017 07:59   Dg Chest Port 1  View  Result Date: 02/19/2017 CLINICAL DATA:  Status post video-assisted thoracic surgery EXAM: PORTABLE CHEST 1 VIEW COMPARISON:  Portable chest x-ray of February 18, 2017 FINDINGS: The right-sided chest tube is been repositioned such that the tip now lies in the medial aspect of the apex. No significant right-sided pneumothorax is observed. There is a small amount of subcutaneous emphysema in the lower and mid axillary region on the right. The left lung is well-expanded and clear. The heart and pulmonary vascularity are normal. The bony thorax exhibits no acute abnormality. IMPRESSION: The tiny right apical pneumothorax is not evident on this study. The chest tube is been repositioned since it its tip now lies in the medial aspect of the right pulmonary apex. Electronically  Signed   By: David  SwazilandJordan M.D.   On: 02/19/2017 14:01    Scheduled Meds: . acetaminophen  1,000 mg Oral Q6H   Or  . acetaminophen (TYLENOL) oral liquid 160 mg/5 mL  1,000 mg Oral Q6H  . bisacodyl  10 mg Oral Daily  . enoxaparin (LOVENOX) injection  40 mg Subcutaneous Daily  . ferrous sulfate  325 mg Oral TID WC  . senna-docusate  1 tablet Oral QHS  . vitamin C  250 mg Oral TID   Continuous Infusions: . dextrose 5 % and 0.45 % NaCl with KCl 20 mEq/L 10 mL/hr at 02/20/17 0741  . potassium chloride       LOS: 9 days    Time spent: 15 minutes    Vassie LollMadera, Earley Grobe, MD [facility] Pager [phone]  If 7PM-7AM, please contact night-coverage [email or web address] Password [email or web address] 02/20/2017, 5:25 PM

## 2017-02-20 NOTE — Progress Notes (Signed)
PCCM Attending:  Patient status post VATS with right blebectomy & pleural abrasion. PCCM will sign off deferring to CVTS management. Please let me know if we can be of any further assistance in the care of this patient.  Donna ChristenJennings E. Jamison NeighborNestor, M.D. [facility] Pager:  [phone] After 3pm or if no response, call [phone] 6:49 AM 02/20/17

## 2017-02-20 NOTE — Progress Notes (Signed)
Pt has refused ABG at this time satting we removed his aline knowing we needed a blood draw this morning.  Pt was in no distress, on RA satting 97%, watching TV and playing on his phone.  RT will monitor.

## 2017-02-20 NOTE — Op Note (Signed)
[name], [name]                 ACCOUNT NO.:  [id number]  MEDICAL RECORD NO.:  [id number]  LOCATION:  [facility]HT14                         FACILITY:  [facility]  PHYSICIAN:  Lanelle Bal, MD    DATE OF BIRTH:  February 05, 1981  DATE OF PROCEDURE:  02/19/2017 DATE OF DISCHARGE:                              OPERATIVE REPORT   PREOPERATIVE DIAGNOSIS:  Persistent air leak after spontaneous pneumothorax on the right with apical blebs.  POSTOPERATIVE DIAGNOSIS:  Persistent air leak after spontaneous pneumothorax on the right with apical blebs.  SURGICAL PROCEDURE:  Right video-assisted thoracoscopy with stapling of apical blebs, mechanical pleurodesis.  SURGEON:  Lanelle Bal, MD  BRIEF HISTORY:  The patient is a 36 year old, who was admitted with a spontaneous right pneumothorax.  The Pulmonary Service placed a right chest tube and the patient was observed with a persistent air leak for 4 to 5 days.  Ultimately, Dr. Roxan Hockey was consulted.  A trial of water seal slowly decreased the size of the pneumothorax over several days, but because of persistent air leak, it was recommended to the patient that we proceed with right video-assisted thoracoscopy.  CT scan of the chest did reveal probable apical bleb.  Dr. Roxan Hockey was unavailable to proceed with surgery, and I have discussed with the patient the recommendations and concurred and proceeded with surgery as planned.  DESCRIPTION OF PROCEDURE:  The patient with appropriate monitoring lines in place, underwent general endotracheal with a double-lumen endotracheal tube.  Circulating nurse attempted to place a Foley, though this met resistance in the urethra.  A smaller coude catheter was attempted, which also met resistance.  We contacted Dr. Jeffie Pollock from Urology to place a Foley.  We then decided to proceed with the planned operation and have Dr. Jeffie Pollock placed a Foley at the end of the case when he was available.  The patient was turned in  decubitus position with the right side up.  The right chest was prepped and draped.  Appropriate time-out was performed and the previously placed chest tube had been removed.  We then made a single port in the midaxillary line, 6th intercostal space, 30-degree videoscope was introduced.  We could easily visualize a moderate-size apical bleb at the apex of the lung.  The remainder of the lung was clear of blebs.  We then made 2 additional port sites, 1 more posteriorly, 1 more anteriorly, and through the 3 port sites, we were able to manipulate the lung and staple across the base of the apical blebs with a no-knife stapler with 2 firings.  We then placed the patient in a head-down position and instilled saline. There was no evidence of air leak.  A mechanical pleurodesis was performed.  All the fluid was evacuated from the chest.   A single chest tube #28  was placed in the middle port site.  The anterior and posterior port sites were closed with interrupted sutures and a 4-0 subcuticular stitch.  Dermabond was applied.  The lung fully reinflated at the completion of the case.  There was no air leak.  The patient was awakened and extubated in the operating room.  Sponge and needle count was reported  as correct at the completion of procedure. Blood loss was less than 25 mL.  The patient tolerated the procedure without obvious complication and was transferred to recovery room for further postoperative care.    Lanelle Bal, MD     EG/MEDQ  D:  02/20/2017  T:  02/20/2017  Job:  [id number]

## 2017-02-20 NOTE — Care Management Note (Signed)
Case Management Note  Patient Details  Name: [name] MRN: [id number] Date of Birth: [birth year]  Subjective/Objective:      Pt admitted with spontaneous pnemo  Action/Plan:  PTA independent from home.    Pt has Chest tube placed to water seal today.  Pt will need clinic appt and possibly MATCH prior to discharge.  CM will continue to follow for discharge needs   Expected Discharge Date:  02/14/17               Expected Discharge Plan:  Home/Self Care  In-House Referral:  Clinical Social Work  Discharge planning Services  CM Consult  Post Acute Care Choice:    Choice offered to:     DME Arranged:    DME Agency:     HH Arranged:    HH Agency:     Status of Service:     If discussed at [employer] Meetings, dates discussed:    Additional Comments:  02/20/2017 Pt is now Pugh/p VATS and stapling of blebs.  CT remains connected to suction [name], Roger Iacobucci S, RN 02/20/2017, 12:00 PM

## 2017-02-21 ENCOUNTER — Inpatient Hospital Stay (HOSPITAL_COMMUNITY): Payer: Self-pay

## 2017-02-21 DIAGNOSIS — D509 Iron deficiency anemia, unspecified: Secondary | ICD-10-CM

## 2017-02-21 DIAGNOSIS — R338 Other retention of urine: Secondary | ICD-10-CM

## 2017-02-21 LAB — CBC
HCT: 32.2 % — ABNORMAL LOW (ref 39.0–52.0)
Hemoglobin: 10.9 g/dL — ABNORMAL LOW (ref 13.0–17.0)
MCH: 23.4 pg — ABNORMAL LOW (ref 26.0–34.0)
MCHC: 33.9 g/dL (ref 30.0–36.0)
MCV: 69.2 fL — ABNORMAL LOW (ref 78.0–100.0)
Platelets: 271 10*3/uL (ref 150–400)
RBC: 4.65 MIL/uL (ref 4.22–5.81)
RDW: 13.2 % (ref 11.5–15.5)
WBC: 11.3 10*3/uL — ABNORMAL HIGH (ref 4.0–10.5)

## 2017-02-21 LAB — COMPREHENSIVE METABOLIC PANEL
ALT: 10 U/L — ABNORMAL LOW (ref 17–63)
AST: 13 U/L — ABNORMAL LOW (ref 15–41)
Albumin: 2.6 g/dL — ABNORMAL LOW (ref 3.5–5.0)
Alkaline Phosphatase: 54 U/L (ref 38–126)
Anion gap: 7 (ref 5–15)
BUN: 6 mg/dL (ref 6–20)
CO2: 32 mmol/L (ref 22–32)
Calcium: 8.8 mg/dL — ABNORMAL LOW (ref 8.9–10.3)
Chloride: 101 mmol/L (ref 101–111)
Creatinine, Ser: 0.94 mg/dL (ref 0.61–1.24)
GFR calc Af Amer: >60 mL/min (ref >60)
GFR calc non Af Amer: >60 mL/min (ref >60)
Glucose, Bld: 98 mg/dL (ref 65–99)
Potassium: 4.3 mmol/L (ref 3.5–5.1)
Sodium: 140 mmol/L (ref 135–145)
Total Bilirubin: 0.3 mg/dL (ref 0.3–1.2)
Total Protein: 6.2 g/dL — ABNORMAL LOW (ref 6.5–8.1)

## 2017-02-21 NOTE — Progress Notes (Signed)
  PROGRESS NOTE  [name] [id number] DOB: Mar 19, 1981 DOA: 02/11/2017 PCP: Patient, No Pcp Per  Brief Narrative: 36yom PMH marijuana, cocaine abuse admitted for spontaneous right pneumothorax status post cold symptoms at home with coughing. In the emergency department chest tube was placed. [facility] and admitted for further evaluation.  Assessment/Plan Spontaneous right pneumothorax, thought secondary to smoking, marijuana use and coughing, status post chest tube placement 8/5. No underlying lung disease appreciated per pulmonology. Status post VATS 8/13 with stapling of apical bleb. -Repeat chest x-ray without acute features. Management per thoracic surgery.  Iron deficiency anemia anemia, thought secondary to polysubstance abuse and poor nutrition. Anemia panel suggested iron deficiency. -Remains stable. Continue ferrous sulfate and vitamin C. -Recommend follow-up in the outpatient setting, repeat CBC. Consider further evaluation if remains anemic.  Acute kidney injury resolved with IV hydration.  Urinary retention, secondary to urethral spasm. Urology placed a difficult Foley catheter and recommended keeping for 48 hours. -Attempt voiding trial today. -Patient to follow-up with Dr. Annabell HowellsWrenn in 2 weeks, urology to arrange  Polysubstance abuse. Urine drug screen positive for opiates, marijuana, cocaine. -Cessation has been recommended  Seems to be improving. Anticipate discharge when cleared by thoracic surgery, after removal of chest tube and Foley catheter.  DVT prophylaxis: enoxaparin Code Status: full Family Communication: none Disposition Plan: home    Roger Sacksaniel Goodrich, MD  [facility] Direct contact: [phone] --Via amion app OR  --[email or web address]; password [email or web address]  7PM-7AM contact night coverage as above 02/21/2017, 9:38 AM  LOS: 10 days   Consultants:  Pulmonary critical care  Thoracic surgery  Procedures:  8/5 chest tube  placement in the emergency department  8/13 cystoscopy with urethral dilation, insertion of complicated Foley  8/13 Right video-assisted thoracoscopy with stapling of apical blebs, mechanical pleurodesis.  Antimicrobials:    Interval history/Subjective: Feels well, no complaints. Breathing well.  Objective: Vitals: Afebrile, 98.9, 18, 86, 112/61, 97% on room air.  Exam:     Constitutional: Appears calm, comfortable lying in bed.  Respiratory. Clear to auscultation bilaterally. No wheezes, rales or rhonchi. Normal respiratory effort.  Cardiovascular. Regular rate and rhythm. No murmur, rub or gallop.  Psychiatric. Grossly normal mood and affect. Speech fluent and appropriate.   I have personally reviewed the following:   Labs:  Complete metabolic panel unremarkable  Hemoglobin stable 10.9. MCV 69. Platelets within normal limits. WBC 11.3.  Imaging studies:  Chest x-ray and apparently reviewed, atelectasis right base.  Medical tests:     Test discussed with performing physician:    Decision to obtain old records:    Review and summation of old records:    Scheduled Meds: . acetaminophen  1,000 mg Oral Q6H   Or  . acetaminophen (TYLENOL) oral liquid 160 mg/5 mL  1,000 mg Oral Q6H  . bisacodyl  10 mg Oral Daily  . enoxaparin (LOVENOX) injection  40 mg Subcutaneous Daily  . ferrous sulfate  325 mg Oral TID WC  . senna-docusate  1 tablet Oral QHS  . vitamin C  250 mg Oral TID   Continuous Infusions: . dextrose 5 % and 0.45 % NaCl with KCl 20 mEq/L 10 mL/hr at 02/20/17 0741    Principal Problem:   Pneumothorax, acute Active Problems:   Polysubstance abuse   Tobacco use disorder   Iron deficiency anemia   Acute urinary retention   LOS: 10 days

## 2017-02-21 NOTE — Progress Notes (Addendum)
      [address]       [place] [id number]             [phone]      2 Days Post-Op Procedure(s) (LRB): RIGHT VIDEO ASSISTED THORACOSCOPY STAPLING OF APICAL BLEB (Right) CYSTOSCOPY (N/A)   Subjective:  Mr. Michail JewelsMarsh has no complaints.    Objective: Vital signs in last 24 hours: Temp:  [98.6 F (37 C)-100.6 F (38.1 C)] 98.9 F (37.2 C) (08/15 1140) Pulse Rate:  [63-97] 75 (08/15 1140) Cardiac Rhythm: Normal sinus rhythm (08/15 0900) Resp:  [18-21] 19 (08/15 1140) BP: (112-146)/(61-83) 125/74 (08/15 1140) SpO2:  [96 %-99 %] 99 % (08/15 1140)  Intake/Output from previous day: 08/14 0701 - 08/15 0700 In: 1206.3 [P.O.:840; I.V.:366.3] Out: 1555 [Urine:1375; Chest Tube:180]  General appearance: alert, cooperative and no distress Heart: regular rate and rhythm Lungs: clear to auscultation bilaterally Abdomen: soft, non-tender; bowel sounds normal; no masses,  no organomegaly Wound: clean and dry  Lab Results:  Recent Labs  02/20/17 0359 02/21/17 0513  WBC 12.7* 11.3*  HGB 11.2* 10.9*  HCT 33.8* 32.2*  PLT 275 271   BMET:  Recent Labs  02/20/17 0359 02/21/17 0513  NA 139 140  K 4.1 4.3  CL 101 101  CO2 32 32  GLUCOSE 115* 98  BUN <5* 6  CREATININE 0.99 0.94  CALCIUM 8.5* 8.8*    PT/INR: No results for input(s): LABPROT, INR in the last 72 hours. ABG No results found for: PHART, HCO3, TCO2, ACIDBASEDEF, O2SAT CBG (last 3)  No results for input(s): GLUCAP in the last 72 hours.  Assessment/Plan: S/P Procedure(s) (LRB): RIGHT VIDEO ASSISTED THORACOSCOPY STAPLING OF APICAL BLEB (Right) CYSTOSCOPY (N/A)  1. Chest tube- no air leak, CXR is stable- will d/c home today 2. GU- urethral stricture- foley placed by urology will d/c today, patient needs to void prior to discharge 3. Dispo- will d/c chest tube, repeat CXR this afternoon if remains stable, and patient has voided okay to d/c home from our standpoint this afternoon vs. tomorrow   LOS: 10 days    BARRETT, ERIN 02/21/2017  I have seen and examined Lenoria Chimeavon Xxxmarsh and agree with the above assessment  and plan.  Delight OvensEdward B Marilyne Haseley MD [facility] [phone] Office [phone] 02/21/2017 12:32 PM

## 2017-02-21 NOTE — Progress Notes (Signed)
Patient arrived to [phone], alert and oriented, R chest tube to water seal. Patient on RA, VSS, mild pain to chest, requesting we take out his foley cath and chest tube, which I will coordinate once I verify orders. Family at bedside, oriented to room and staff, will continue to monitor.

## 2017-02-21 NOTE — Progress Notes (Signed)
Notified MD Tyrone SageGerhardt about patient's chest xray result, he said he will look at the films to see if there was any need for intervention but that the patient would have to stay today and get repeat xrays done in the morning. Will continue to monitor.

## 2017-02-22 ENCOUNTER — Inpatient Hospital Stay (HOSPITAL_COMMUNITY): Payer: Self-pay

## 2017-02-22 MED ORDER — TRAZODONE HCL 50 MG PO TABS: 25.0000 mg | ORAL_TABLET | Freq: Every evening | ORAL | 0 refills | Status: AC | PRN

## 2017-02-22 MED ORDER — TRAMADOL HCL 50 MG PO TABS: 50.0000 mg | ORAL_TABLET | Freq: Four times a day (QID) | ORAL | 0 refills | Status: DC | PRN

## 2017-02-22 MED ORDER — FERROUS SULFATE 325 (65 FE) MG PO TABS: 325.0000 mg | ORAL_TABLET | Freq: Two times a day (BID) | ORAL | 3 refills | Status: AC

## 2017-02-22 MED ORDER — TRAZODONE HCL 50 MG PO TABS: 25.0000 mg | ORAL_TABLET | Freq: Every evening | ORAL | 0 refills | Status: DC | PRN

## 2017-02-22 MED ORDER — TRAMADOL HCL 50 MG PO TABS: 50.0000 mg | ORAL_TABLET | Freq: Four times a day (QID) | ORAL | 0 refills | Status: AC | PRN

## 2017-02-22 MED ORDER — FERROUS SULFATE 325 (65 FE) MG PO TABS: 325.0000 mg | ORAL_TABLET | Freq: Two times a day (BID) | ORAL | 3 refills | Status: DC

## 2017-02-22 NOTE — Discharge Summary (Addendum)
Physician Discharge Summary  [name]:[id number] DOB: Dec 10, 1980 DOA: 02/11/2017  PCP: [name], No Pcp Per  Admit date: 02/11/2017 Discharge date: 02/22/2017  Recommendations for Outpatient Follow-up:  1. Pt will need to follow up with PCP in 2-3 weeks post discharge 2. Please obtain BMP to evaluate electrolytes and kidney function 3. Please also check CBC to evaluate Hg and Hct levels  Discharge Diagnoses:  Principal Problem:   Pneumothorax, acute Active Problems:   Polysubstance abuse   Tobacco use disorder   Iron deficiency anemia   Acute urinary retention  Discharge Condition: Stable  Diet recommendation: Heart healthy diet discussed in details   History of present illness:  36yom PMH marijuana, cocaine abuse admitted for spontaneous right pneumothorax status post cold symptoms at home with coughing. In the emergency department chest tube was placed. He was transferred to [facility] and admitted for further evaluation.  Assessment/Plan Spontaneous right pneumothorax, thought secondary to smoking, marijuana use and coughing, status post chest tube placement 8/5. No underlying lung disease appreciated per pulmonology. Status post VATS 8/13 with stapling of apical bleb. - resp status stable this AM, pt cleared for discharge   Iron deficiency anemia anemia, thought secondary to polysubstance abuse and poor nutrition. Anemia panel suggested iron deficiency. -Remains stable. Pt to follow up with primary provider for further evaluation   Acute kidney injury resolved with IVF  Encephalopathy - due to sedating medications in pt with known PSA including cocaine, THC, opiates  - now resolved   Urinary retention, secondary to urethral spasm. Urology placed a difficult Foley catheter and recommended keeping for 48 hours. - follow up with Dr. Annabell Howells in 2 weeks, pt made aware   Polysubstance abuse. Urine drug screen positive for opiates, marijuana, cocaine. - cessation  consultation provided    Procedures/Studies: Dg Chest 2 View  Result Date: 02/22/2017 CLINICAL DATA:  Follow-up pneumothorax EXAM: CHEST  2 VIEW COMPARISON:  02/21/2017 FINDINGS: Cardiac shadow is within normal limits. The lungs are well aerated bilaterally. Stable right pneumothorax is again seen without enlargement. Minimal right pleural effusion is again seen and stable. No focal infiltrate is noted. No acute bony abnormality is seen. IMPRESSION: Stable right pneumothorax.  No new focal abnormality is seen. Electronically Signed   By: Alcide Clever M.D.   On: 02/22/2017 10:24   Dg Chest 2 View  Result Date: 02/12/2017 CLINICAL DATA:  Right-sided pneumothorax EXAM: CHEST  2 VIEW COMPARISON:  February 11, 2017 FINDINGS: Chest tube remains on the right, unchanged in position. The previously noted right-sided pneumothorax has become slightly larger and is now all seen along the apical and apicolateral regions. No tension component. There is subcutaneous air on the right laterally. Lungs elsewhere clear. Heart size and pulmonary vascularity are normal. No adenopathy. No bone lesions. IMPRESSION: Pneumothorax on the right is somewhat larger than 1 day prior without tension component. Chest tube remains in place on the right. There is subcutaneous air on the right, not significantly changed. Lungs elsewhere clear. Heart size within normal limits. Electronically Signed   By: Bretta Bang III M.D.   On: 02/12/2017 07:10   Dg Chest 2 View  Result Date: 02/11/2017 CLINICAL DATA:  Initial evaluation for acute chest pain. EXAM: CHEST  2 VIEW COMPARISON:  None. FINDINGS: Cardiac and mediastinal silhouettes within normal limits. Large right pneumothorax with collapse of the right towards the medial right lung base. Mediastinal structures remain relatively midline without obvious tension component. Left lung is clear. No pulmonary edema or  pleural effusion. Osseous structures within normal limits. IMPRESSION:  Large right pneumothorax. No significant mediastinal shift to suggest tension component identified. Critical Value/emergent results were called by telephone at the time of interpretation on 02/11/2017 at 5:09 am to Dr. Raeford Razor , who verbally acknowledged these results. Electronically Signed   By: Rise Mu M.D.   On: 02/11/2017 05:12   Ct Chest Wo Contrast  Result Date: 02/12/2017 CLINICAL DATA:  Right chest tube in place for pneumothorax. Follow-up. EXAM: CT CHEST WITHOUT CONTRAST TECHNIQUE: Multidetector CT imaging of the chest was performed following the standard protocol without IV contrast. COMPARISON:  Plain films of earlier today and back to 1 day prior. FINDINGS: Cardiovascular: Normal caliber of the thoracic aorta. Normal heart size, without pericardial effusion. Mediastinum/Nodes: No mediastinal or definite hilar adenopathy, given limitations of unenhanced CT. Lungs/Pleura: Trace right-sided pleural fluid. Right chest tube terminates anteriorly on image 67/series 4. There is trace residual right-sided pneumothorax, including inferiorly on image 139/series 4. No left pleural fluid. Mild right lower lobe subsegmental atelectasis. Upper Abdomen: Normal imaged portions of the liver, spleen, stomach, pancreas, adrenal glands, right kidney. Probable 5 mm left renal pelvic stone versus less likely isolated vascular calcification. Musculoskeletal: Mild right chest wall subcutaneous emphysema. IMPRESSION: 1. Right chest tube in place with minimal residual right-sided hydropneumothorax. 2. No acute superimposed thoracic process. 3. Probable left nephrolithiasis. Electronically Signed   By: Jeronimo Greaves M.D.   On: 02/12/2017 09:25   Dg Chest 2v Repeat Same Day  Result Date: 02/21/2017 CLINICAL DATA:  Chest tube removal, RIGHT pneumothorax EXAM: CHEST  2 VIEW COMPARISON:  Earlier study of 02/21/2017 at 0659 hours FINDINGS: Interval removal of RIGHT thoracostomy tube. Small RIGHT apex  pneumothorax. Small RIGHT pleural effusion. LEFT lung clear. No mediastinal shift. Bones unremarkable. IMPRESSION: Small RIGHT hydropneumothorax post thoracostomy tube removal. Findings called to [name]'s nurse Lyla Son RN on 2Central On 02/21/2017 at 1506 hours. Electronically Signed   By: Ulyses Southward M.D.   On: 02/21/2017 15:07   Dg Chest Port 1 View  Result Date: 02/21/2017 CLINICAL DATA:  Chest tube. EXAM: PORTABLE CHEST 1 VIEW COMPARISON:  02/20/2017 . FINDINGS: Right chest tube in stable position. No pneumothorax . Mediastinum and hilar structures are normal. Heart size stable. Right base subsegmental atelectasis/ infiltrate. No prominent pleural effusion. Mild right chest wall subcutaneous emphysema. IMPRESSION: 1. Right chest tube in stable position.  No pneumothorax. 2.  Mild right base subsegmental atelectasis/infiltrate. Electronically Signed   By: Maisie Fus  Register   On: 02/21/2017 07:50   Dg Chest Port 1 View  Result Date: 02/20/2017 CLINICAL DATA:  Evaluate chest tube position . EXAM: PORTABLE CHEST 1 VIEW COMPARISON:  02/19/2017. FINDINGS: Right chest tube in stable position. No pneumothorax. Stable mild right chest wall subcutaneous emphysema. No focal infiltrate. Stable cardiomegaly. Vascular stent noted over the right subclavian region and stable position. IMPRESSION: Stable position of right chest tube.  No pneumothorax. Electronically Signed   By: Maisie Fus  Register   On: 02/20/2017 07:59   Dg Chest Port 1 View  Result Date: 02/19/2017 CLINICAL DATA:  Status post video-assisted thoracic surgery EXAM: PORTABLE CHEST 1 VIEW COMPARISON:  Portable chest x-ray of February 18, 2017 FINDINGS: The right-sided chest tube is been repositioned such that the tip now lies in the medial aspect of the apex. No significant right-sided pneumothorax is observed. There is a small amount of subcutaneous emphysema in the lower and mid axillary region on the right. The left lung is well-expanded and  clear. The  heart and pulmonary vascularity are normal. The bony thorax exhibits no acute abnormality. IMPRESSION: The tiny right apical pneumothorax is not evident on this study. The chest tube is been repositioned since it its tip now lies in the medial aspect of the right pulmonary apex. Electronically Signed   By: David  SwazilandJordan M.D.   On: 02/19/2017 14:01   Dg Chest Port 1 View  Result Date: 02/16/2017 CLINICAL DATA:  Pneumothorax EXAM: PORTABLE CHEST 1 VIEW COMPARISON:  February 15, 2017 FINDINGS: Chest tube remains on the right without change in position. The apical and apicolateral pneumothorax in the right is unchanged in size and contour. No tension component. A small amount of subcutaneous air remains on the right. There is no edema or consolidation. Heart size and pulmonary vascularity are normal. No adenopathy. No evident bone lesions. IMPRESSION: Stable chest tube position on the right apical and apicolateral pneumothorax unchanged. No tension component. Lungs clear. Stable subcutaneous air on the right. Stable cardiac silhouette. Electronically Signed   By: Bretta BangWilliam  Woodruff III M.D.   On: 02/16/2017 09:12   Dg Chest Port 1 View  Result Date: 02/15/2017 CLINICAL DATA:  Pneumothorax with right chest tube in place EXAM: PORTABLE CHEST 1 VIEW COMPARISON:  February 14, 2017 FINDINGS: Chest tube remains in place on the right, stable. Fairly small right apical and apicolateral pneumothorax remains without appreciable change from 1 day prior. No tension component. There is soft tissue air on the right, stable. No edema or consolidation. The heart size and pulmonary vascularity are normal. No adenopathy. No evident bone lesions. IMPRESSION: Stable chest tube position on the right with fairly small right apical and apicolateral pneumothorax. No tension component. Stable soft tissue air on the right. No edema or consolidation. Cardiac silhouette within normal limits and stable. Electronically Signed   By: Bretta BangWilliam  Woodruff  III M.D.   On: 02/15/2017 08:07   Dg Chest Port 1 View  Result Date: 02/14/2017 CLINICAL DATA:  Right pneumothorax. EXAM: PORTABLE CHEST 1 VIEW COMPARISON:  Radiograph of February 13, 2017. FINDINGS: The heart size and mediastinal contours are within normal limits. Left lung is clear. Right-sided chest tube is unchanged in position. Right apical pneumothorax is significantly smaller compared to prior exam, with only minimal pneumothorax seen currently. Left lung is clear. The visualized skeletal structures are unremarkable. IMPRESSION: Right pneumothorax is significantly improved compared to prior exam. Right-sided chest tube is unchanged in position. Electronically Signed   By: Lupita RaiderJames  Green Jr, M.D.   On: 02/14/2017 08:20   Dg Chest Port 1 View  Result Date: 02/13/2017 CLINICAL DATA:  Right pneumothorax. EXAM: PORTABLE CHEST 1 VIEW COMPARISON:  Radiograph of same day. FINDINGS: The heart size and mediastinal contours are within normal limits. Left lung is clear. Right-sided chest tube is unchanged in position. Moderate right pneumothorax is noted which is significantly enlarged compared to prior exam. Stable subcutaneous emphysema seen over right lateral chest wall. The visualized skeletal structures are unremarkable. IMPRESSION: Moderate right pneumothorax is noted which is increased in size compared to prior exam. These results will be called to the ordering clinician or representative by the Radiologist Assistant, and communication documented in the PACS or zVision Dashboard. Electronically Signed   By: Lupita RaiderJames  Green Jr, M.D.   On: 02/13/2017 13:47   Dg Chest Port 1 View  Result Date: 02/13/2017 CLINICAL DATA:  Respiratory failure.  Pneumothorax EXAM: PORTABLE CHEST 1 VIEW COMPARISON:  02/12/2017 FINDINGS: Right chest tube unchanged in position. Small right  apical pneumothorax has improved in the interval. No effusion. Both lungs are clear without infiltrate or collapse IMPRESSION: Small right apical  pneumothorax improved from yesterday. Electronically Signed   By: Marlan Palau M.D.   On: 02/13/2017 07:19   Dg Chest Port 1 View  Result Date: 02/11/2017 CLINICAL DATA:  Follow-up pneumothorax. EXAM: PORTABLE CHEST 1 VIEW COMPARISON:  Chest radiograph February 11, 2017 at 0525 hours FINDINGS: Trace RIGHT apical pneumothorax. No mediastinal shift. RIGHT R upper lobe bandlike density, decreased from prior examination. RIGHT chest in place, side port projecting within the chest wall though, retracted from prior examination. No pleural effusion or focal consolidation. RIGHT chest wall subcutaneous gas. Osseous structures are unchanged. IMPRESSION: Retracted chest tube.  Trace residual RIGHT apical pneumothorax. Decreased RIGHT upper lobe atelectasis. Electronically Signed   By: Awilda Metro M.D.   On: 02/11/2017 17:25   Dg Chest Portable 1 View  Result Date: 02/11/2017 CLINICAL DATA:  Chest tube placement.  Initial encounter. EXAM: PORTABLE CHEST 1 VIEW COMPARISON:  Chest radiograph performed earlier today at 4:40 a.m. FINDINGS: Status post placement of a right basilar chest tube, the right-sided pneumothorax has largely resolved, with a trace right apical pneumothorax seen. Underlying right-sided atelectasis is noted. The left lung appears clear. No pleural effusion is seen. The cardiomediastinal silhouette is normal in size. No acute osseous abnormalities are identified. IMPRESSION: Status post placement of right basilar chest tube, right-sided pneumothorax has largely resolved, with a trace residual right apical pneumothorax seen. Underlying right-sided atelectasis noted. Electronically Signed   By: Roanna Raider M.D.   On: 02/11/2017 05:51   Dg Chest Port 1v Same Day  Result Date: 02/18/2017 CLINICAL DATA:  Follow-up pneumothorax EXAM: PORTABLE CHEST 1 VIEW COMPARISON:  02/17/2017 FINDINGS: Tiny residual right apical pneumothorax is noted and stable. No new focal abnormality is seen. Cardiac shadow  is within normal limits. Right thoracostomy catheter is again identified. No bony abnormality is seen. IMPRESSION: Stable tiny right residual pneumothorax Electronically Signed   By: Alcide Clever M.D.   On: 02/18/2017 07:07   Dg Chest Port 1v Same Day  Result Date: 02/17/2017 CLINICAL DATA:  Evaluate pneumothorax EXAM: PORTABLE CHEST 1 VIEW COMPARISON:  02/16/2017 FINDINGS: Cardiac shadow is within normal limits. Right chest tube is again seen. Tiny apical right pneumothorax is noted slightly smaller than that seen on prior exam. Left lung remains clear. No bony abnormality noted. IMPRESSION: Tiny residual right pneumothorax improved from the prior exam. Electronically Signed   By: Alcide Clever M.D.   On: 02/17/2017 11:05     Discharge Exam: Vitals:   02/21/17 2144 02/22/17 0629  BP: 127/81 125/74  Pulse: 77 83  Resp: 19 18  Temp: 98.1 F (36.7 C) 99.6 F (37.6 C)  SpO2: 98% 98%   Vitals:   02/21/17 1140 02/21/17 1209 02/21/17 2144 02/22/17 0629  BP: 125/74 127/83 127/81 125/74  Pulse: 75 70 77 83  Resp: 19 18 19 18   Temp: 98.9 F (37.2 C) 98.6 F (37 C) 98.1 F (36.7 C) 99.6 F (37.6 C)  TempSrc: Oral Oral Oral Oral  SpO2: 99% 97% 98% 98%  Weight:      Height:        General: Pt is alert, follows commands appropriately, not in acute distress Cardiovascular: Regular rate and rhythm, S1/S2 +, no murmurs, no rubs, no gallops Respiratory: Clear to auscultation bilaterally, no wheezing, no crackles, no rhonchi Abdominal: Soft, non tender, non distended, bowel sounds +, no guarding  Discharge Instructions  Discharge Instructions    Diet - low sodium heart healthy    Complete by:  As directed    Increase activity slowly    Complete by:  As directed      Allergies as of 02/22/2017      Reactions   Bee Venom Swelling   No Known Allergies       Medication List    TAKE these medications   ferrous sulfate 325 (65 FE) MG tablet Take 1 tablet (325 mg total) by mouth 2  (two) times daily with a meal.   traMADol 50 MG tablet Commonly known as:  ULTRAM Take 1-2 tablets (50-100 mg total) by mouth every 6 (six) hours as needed (mild pain).   traZODone 50 MG tablet Commonly known as:  DESYREL Take 0.5 tablets (25 mg total) by mouth at bedtime as needed for sleep.      Follow-up Information    [facility]. Schedule an appointment as soon as possible for a visit.   Contact information: [address] [place] [address] [phone]       Bjorn Pippin, MD Follow up.   Specialty:  Urology Why:  Please call the office for a follow up appointment for 2-3 weeks post discharge.  You can see Dr. Annabell Howells or one of our Nurse Practitioners.  Contact information: [address] [place] [address] [phone]        Delight Ovens, MD Follow up.   Specialty:  Cardiothoracic Surgery Why:  see the PA on 03/05/17 at 2:45 pm. Obtain a chest xray at 2:15 pm at [facility] which is located in the same office complex.  Contact information: [address] [place] [address] [phone]        [facility] Follow up.   Specialty:  Cardiothoracic Surgery Why:  nurse appointment for suture removal on 03/01/2017 at 10:30 Contact information: [address] [place], [address] [place] [address] [phone]           The results of significant diagnostics from this hospitalization (including imaging, microbiology, ancillary and laboratory) are listed below for reference.     Microbiology: No results found for this or any previous visit (from the past 240 hour(s)).   Labs: Basic Metabolic Panel:  Recent Labs Lab 02/16/17 0251 02/19/17 0323 02/20/17 0359 02/21/17 0513  NA 143 141 139 140  K 4.2 3.7 4.1 4.3  CL 104 102 101 101  CO2 30 31 32 32  GLUCOSE 91 86 115* 98  BUN 7 6 <5* 6  CREATININE 0.96 0.91 0.99  0.94  CALCIUM 9.1 8.8* 8.5* 8.8*   Liver Function Tests:  Recent Labs Lab 02/21/17 0513  AST 13*  ALT 10*  ALKPHOS 54  BILITOT 0.3  PROT 6.2*  ALBUMIN 2.6*   CBC:  Recent Labs Lab 02/16/17 0251 02/19/17 0323 02/20/17 0359 02/21/17 0513  WBC 5.2 8.4 12.7* 11.3*  HGB 12.5* 11.6* 11.2* 10.9*  HCT 38.0* 34.8* 33.8* 32.2*  MCV 71.2* 70.2* 70.9* 69.2*  PLT 220 289 275 271    SIGNED: Time coordinating discharge: 60 minutes  Debbora Presto, MD  [facility] 02/22/2017, 1:55 PM Pager [phone]  If 7PM-7AM, please contact night-coverage [email or web address] Password [email or web address]

## 2017-02-22 NOTE — Progress Notes (Signed)
Patient ia wanting to go off unit. Md txt, still awaiting for response but patient insisited to go out, patient becaoming verbally offensive to staff. Will continue to monitor.

## 2017-02-22 NOTE — Discharge Instructions (Signed)
Pneumothorax A pneumothorax, commonly called a collapsed lung, is a condition in which air leaks from a lung and builds up in the space between the lung and the chest wall (pleural space). The air in a pneumothorax is trapped outside the lung and takes up space, preventing the lung from fully expanding. This is a condition that usually occurs suddenly. The buildup of air may be small or large. A small pneumothorax may go away on its own. When a pneumothorax is larger, it will often require medical treatment and hospitalization. Video-Assisted Thoracic Surgery, Care After This sheet gives you information about how to care for yourself after your procedure. Your doctor may also give you more specific instructions. If you have problems or questions, contact your doctor. What can I expect after the procedure? After the procedure, it is common to have:  Some pain and soreness in your chest.  Pain when you breathe in (inhale) and cough.  Trouble pooping (constipation).  Tiredness (fatigue).  Trouble sleeping.  Follow these instructions at home: Preventing lung infection ( pneumonia)  Take deep breaths or do breathing exercises as told by your doctor.  Cough often. Coughing is important to clear thick spit (phlegm) and open your lungs.  You can make coughing hurt less if you try supporting (splinting) your chest. Try one of these when you cough: ? Hold a pillow against your chest. ? Place both hands flat on top of your cut.  Use an incentive spirometer as told by your doctor. This is a tool that measures how well you can fill your lungs with each breath.  Do lung therapy (pulmonary rehabilitation) as told. Medicines  Take over-the-counter or prescription medicines only as told by your doctor.  If you have pain, take pain-relieving medicine before your pain gets very bad. Doing this will help you breathe and cough more comfortably.  If you were prescribed an antibiotic medicine, take it  as told by your doctor. Do not stop taking the antibiotic even if you start to feel better. Activity  Ask your doctor what activities are safe for you.  Avoid activities that use your chest muscles for 3-4 weeks or longer.  Do not lift anything that is heavier than 10 lb (4.5 kg), or the limit that your doctor tells you, until he or she says that it is safe. Cut ( incision) care  Follow instructions from your doctor about how to take care of your cut(s) from surgery. Make sure you: ? Wash your hands with soap and water before you change your bandage (dressing). If you cannot use soap and water, use hand sanitizer. ? Change your bandage as told by your doctor. ? Leave stitches (sutures), skin glue, or skin tape (adhesive) strips in place. They may need to stay in place for 2 weeks or longer. If tape strips get loose and curl up, you may trim the loose edges. Do not remove tape strips completely unless your doctor says it is okay.  Keep your bandage dry until it has been removed.  Every day, check the area around your cut(s) for signs of infection. Check for: ? Redness, swelling, or pain. ? Fluid or blood. ? Warmth. ? Pus or a bad smell. Bathing  Do not take baths, swim, or use a hot tub until your doctor approves. You may take showers.  After your bandage is removed, use soap and water to gently wash the your cut(s) from surgery. Do not use anything else to clean your cut(s) unless  your doctor tells you to do this. Driving  Do not drive until your doctor approves.  Do not drive or use heavy machinery while taking prescription pain medicine. Eating and drinking  Eat a healthy diet as told by your doctor. A healthy diet includes: ? Lots of fresh fruits and vegetables. ? Whole grains. ? Low-fat (lean) proteins.  Limit foods that are high in fat and processed sugars. These include fried and sweet foods.  Drink enough fluid to keep your pee (urine) clear or light yellow. General  instructions  To prevent or treat trouble pooping while you are taking prescription pain medicine, your doctor may recommend that you: ? Take over-the-counter or prescription medicines. ? Eat foods that have a lot of fiber. These include beans, fresh fruits and vegetables, and whole grains.  Do not use any products that contain nicotine or tobacco. These include cigarettes and e-cigarettes. If you need help quitting, ask your doctor.  Avoid being where people are smoking (avoid secondhand smoke).  Wear compression stockings as told by your doctor. These stockings help you: ? Not get blood clots in your legs. ? Have less swelling in your legs.  If you have a chest tube, care for it as told by your doctor.  Do not travel by airplane during the 2 weeks after your chest tube is removed, or until your doctor says that this is safe.  Keep all follow-up visits as told by your doctor. This is important. Contact a doctor if:  You have redness, swelling, or pain around a cut from surgery.  You have fluid or blood coming from a cut from surgery.  Your cut(s) from surgery feel warm to the touch.  You have pus or a bad smell coming from a cut from surgery.  You have a fever or chills.  You feel sick to your stomach (nauseous).  You throw up (vomit).  You have pain that does not get better with medicine. Get help right away if:  You have chest pain.  Your heart is fluttering or beating fast.  You start to have a rash.  You have shortness of breath.  You have trouble breathing.  You are confused.  You have trouble talking or understanding.  You feel weak, light-headed, or dizzy.  You faint. Summary  To help prevent lung infection (pneumonia), take deep breaths or do breathing exercises as told by your doctor.  Cough often. This is important for clearing chest fluid (phlegm). You can make coughing hurt less if you hold a pillow to your chest or put your hands flat on the  cut(s) when you cough (do splinting).  Do not drive until your doctor approves.  Every day, check your cut(s) for signs of infection. These signs can be redness, swelling, pain, fluid, blood, warmth, pus, or a bad smell.  Eat a healthy diet. This includes lots of fresh fruits and vegetables, whole grains, and low-fat (lean) proteins. This information is not intended to replace advice given to you by your health care provider. Make sure you discuss any questions you have with your health care provider. Document Released: 10/21/2012 Document Revised: 06/05/2016 Document Reviewed: 06/05/2016 Elsevier Interactive Patient Education  2017 [employer]  What are the causes? A pneumothorax can sometimes happen quickly with no apparent cause. People with underlying lung problems, particularly COPD or emphysema, are at higher risk of pneumothorax. However, pneumothorax can happen quickly even in people with no prior known lung problems. Trauma, surgery, medical procedures, or injury  to the chest wall can also cause a pneumothorax. What are the signs or symptoms? Sometimes a pneumothorax will have no symptoms. When symptoms are present, they can include:  Chest pain.  Shortness of breath.  Increased rate of breathing.  Bluish color to your lips or skin (cyanosis).  How is this diagnosed? Pneumothorax is usually diagnosed by a chest X-ray or chest CT scan. Your health care provider will also take a medical history and perform a physical exam to determine why you may have a pneumothorax. How is this treated? A small pneumothorax may go away on its own without treatment. Extra oxygen can sometimes help a small pneumothorax go away more quickly. For a larger pneumothorax or a pneumothorax that is causing symptoms, a procedure is usually needed to drain the air.In some cases, the health care provider may drain the air using a needle. In other cases, a chest tube may be inserted into the pleural space.  A chest tube is a small tube placed between the ribs and into the pleural space. This removes the extra air and allows the lung to expand back to its normal size. A large pneumothorax will usually require a hospital stay. If there is ongoing air leakage into the pleural space, then the chest tube may need to remain in place for several days until the air leak has healed. In some cases, surgery may be needed. Follow these instructions at home:  Only take over-the-counter or prescription medicines as directed by your health care provider.  If a cough or pain makes it difficult for you to sleep at night, try sleeping in a semi-upright position in a recliner or by using 2 or 3 pillows.  Rest and limit activity as directed by your health care provider.  If you had a chest tube and it was removed, ask your health care provider when it is okay to remove the dressing. Until your health care provider says you can remove the dressing, do not allow it to get wet.  Do not smoke. Smoking is a risk factor for pneumothorax.  Do not fly in an airplane or scuba dive until your health care provider says it is okay.  Follow up with your health care provider as directed. Get help right away if:  You have increasing chest pain or shortness of breath.  You have a cough that is not controlled with suppressants.  You begin coughing up blood.  You have pain that is getting worse or is not controlled with medicines.  You cough up thick, discolored mucus (sputum) that is yellow to green in color.  You have redness, increasing pain, or discharge at the site where a chest tube had been in place (if your pneumothorax was treated with a chest tube).  The site where your chest tube was located opens up.  You feel air coming out of the site where the chest tube was placed.  You have a fever or persistent symptoms for more than 2-3 days.  You have a fever and your symptoms suddenly get worse. This information is  not intended to replace advice given to you by your health care provider. Make sure you discuss any questions you have with your health care provider. Document Released: 06/26/2005 Document Revised: 12/02/2015 Document Reviewed: 11/19/2013 Elsevier Interactive Patient Education  [employer].

## 2017-02-22 NOTE — Progress Notes (Signed)
Patient requesting for Md to come. Explain to the patient that attending is probably waiting for cardiothoracic surgeon but patient very upset cursing , saying the F word and BS to this Clinical research associatewriter. Dressing done to right chest and patient grab the dressing supplies stating let me do it my way. Patient was asked politely if he can refrain from saying those cursing words but patient continue to say it  And stated that it is this right to say those words. Charge nurse made aware of the patient behavior.

## 2017-02-22 NOTE — Progress Notes (Signed)
Discharge paperwork given to patient. No question verbalized. Patient is ready to discharge.

## 2017-02-22 NOTE — Progress Notes (Signed)
Per Dr. Izola PriceMyers, all of patient's prescriptions have been sent to the pharmacy.

## 2017-02-22 NOTE — Progress Notes (Signed)
      [address]       [place] [id number]             [phone]      today's chest xray reviewed , no change from yesterday No significant PTX, ok for d/c from surgical point of view  Delight OvensEdward B Eviana Sibilia MD

## 2017-03-02 ENCOUNTER — Other Ambulatory Visit: Payer: Self-pay | Admitting: Cardiothoracic Surgery

## 2017-03-02 DIAGNOSIS — J939 Pneumothorax, unspecified: Secondary | ICD-10-CM

## 2017-03-05 ENCOUNTER — Encounter: Payer: Self-pay | Admitting: Physician Assistant

## 2017-03-05 ENCOUNTER — Ambulatory Visit
Admission: RE | Admit: 2017-03-05 | Discharge: 2017-03-05 | Disposition: A | Discharge status: Home / Self Care | Payer: Self-pay | Source: Ambulatory Visit | Attending: Cardiothoracic Surgery | Admitting: Cardiothoracic Surgery

## 2017-03-05 ENCOUNTER — Ambulatory Visit (INDEPENDENT_AMBULATORY_CARE_PROVIDER_SITE_OTHER): Payer: Self-pay | Admitting: Physician Assistant

## 2017-03-05 VITALS — BP 133/79 | HR 90 | Resp 20 | Ht 71.0 in | Wt 144.0 lb

## 2017-03-05 DIAGNOSIS — IMO0002 Reserved for concepts with insufficient information to code with codable children: Secondary | ICD-10-CM

## 2017-03-05 DIAGNOSIS — Z09 Encounter for follow-up examination after completed treatment for conditions other than malignant neoplasm: Secondary | ICD-10-CM

## 2017-03-05 DIAGNOSIS — J939 Pneumothorax, unspecified: Secondary | ICD-10-CM

## 2017-03-05 DIAGNOSIS — J9311 Primary spontaneous pneumothorax: Secondary | ICD-10-CM

## 2017-03-05 DIAGNOSIS — J9382 Other air leak: Secondary | ICD-10-CM

## 2017-03-05 NOTE — Progress Notes (Signed)
Surgery: Right video-assisted thoracoscopy with stapling of apical blebs, mechanical pleurodesis by Dr. Tyrone Sage on 02/19/2017.  HPI: Patient returns for routine postoperative follow-up having undergone a right  video-assisted thoracoscopy with stapling of apical blebs, mechanical pleurodesis on 02/19/2017. The patient's early postoperative recovery while in the hospital was notable for small, Stable right pneumothorax. Since hospital discharge the patient reports Tramadol makes him feel loopy sometimes, but he only takes for severe pain. He denies shortness of breath, chest pain, fever, or chills.   Current Outpatient Prescriptions  Medication Sig Dispense Refill  . ferrous sulfate 325 (65 FE) MG tablet Take 1 tablet (325 mg total) by mouth 2 (two) times daily with a meal. 60 tablet 3  . traMADol (ULTRAM) 50 MG tablet Take 1-2 tablets (50-100 mg total) by mouth every 6 (six) hours as needed (mild pain). 45 tablet 0  . traZODone (DESYREL) 50 MG tablet Take 0.5 tablets (25 mg total) by mouth at bedtime as needed for sleep. 30 tablet 0  Vital Signs: BP 133/79, HR 90, RR 20, Oxygen saturation 95% on room air.   Physical Exam: CV-RRR Pulmonary-Clear to auscultation bilaterally Wounds-clean and dry. 2 silk sutures removed.  Diagnostic Tests: CLINICAL DATA:  36 year old male status post VATS on 02/19/2017. Evaluate for pneumothorax.  EXAM: CHEST  2 VIEW  COMPARISON:  None. Due to technical issues, no studies are available for comparison.  FINDINGS: The cardiomediastinal silhouette is unremarkable.  Surgical changes at the right lung apex are noted.  There is no evidence of focal airspace disease, pulmonary edema, suspicious pulmonary nodule/mass, pleural effusion, or pneumothorax. No acute bony abnormalities are identified.  IMPRESSION: No evidence of pneumothorax or acute cardiopulmonary disease.   Electronically Signed   By: Harmon Pier M.D.   On: 03/05/2017  14:53  Impression and Plan: Overall, [name] has recovered well from right lung surgery. He was instructed he may begin driving once not taking anymore Tramadol, since makes him loopy. He was instructed to remove band aid in am and use soap and water (he was using peroxide) to cleans wounds. He was instructed he may use a band aid and change everyday (wound where sutures were removed) or leave open to air as is not draining. We discussed the findings on the x ray that the right lung is fully re expanded. He was encouraged to continue with smoking cessation. He was told if he develops acute onset shortness of breath or right sided chest pain, to go to ER;otherwise, he will return to see Dr. Tyrone Sage PRN.     Roger Balls, PA-C [facility] [phone]

## 2017-03-05 NOTE — Patient Instructions (Signed)
You may return to driving an automobile as long as you are no longer requiring oral narcotic pain relievers during the daytime.  It would be wise to start driving only short distances during the daylight and gradually increase from there as you feel comfortable.  He was instructed to not fly or lift anything heavy for at least 2-4 more weeks.

## 2019-05-02 IMAGING — DX DG CHEST 2V
2 series · 2 of 2 positions shown · non-contrast
Comparison: 02/21/2017

CLINICAL DATA: Follow-up pneumothorax

EXAM:
CHEST  2 VIEW

[chest pa]
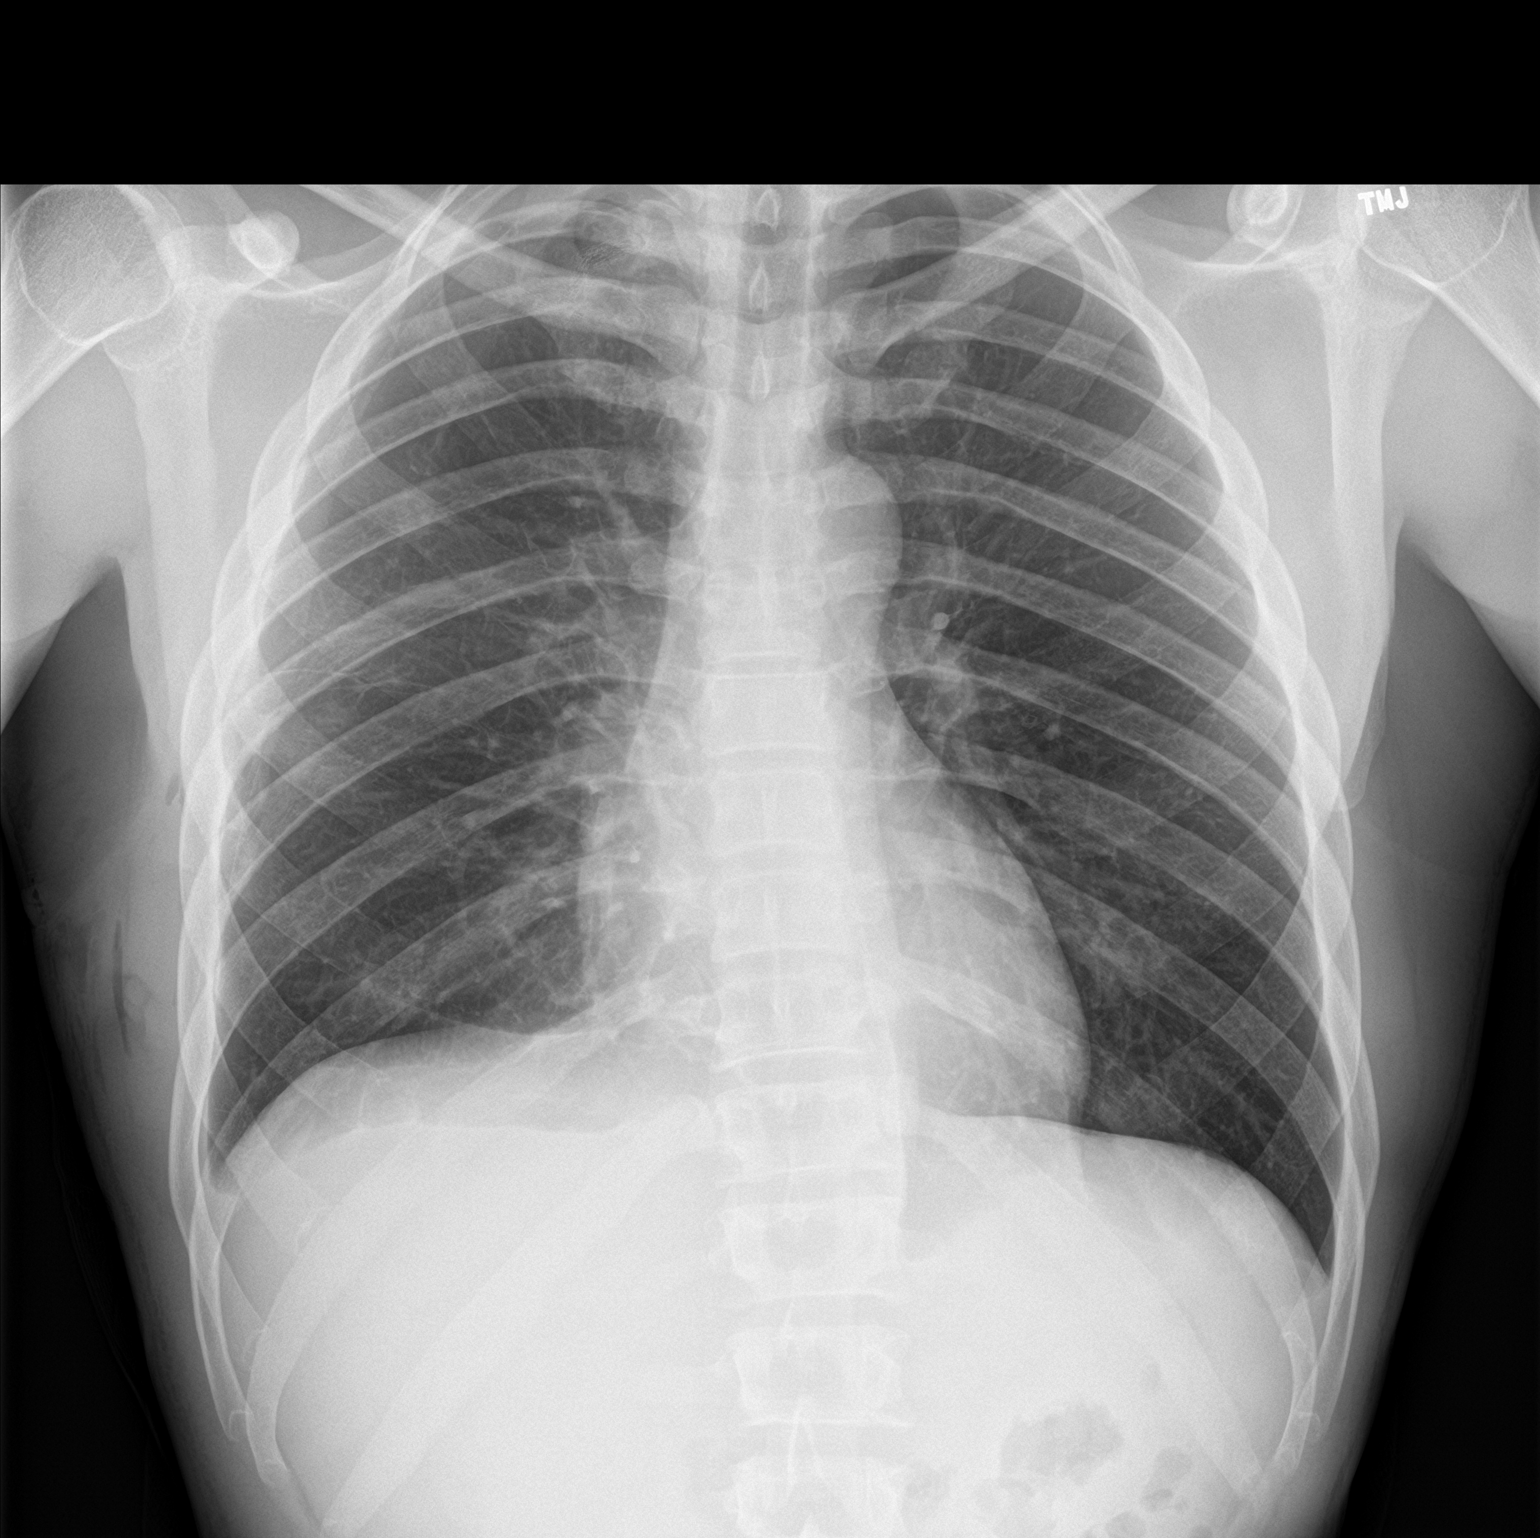

[chest lat]
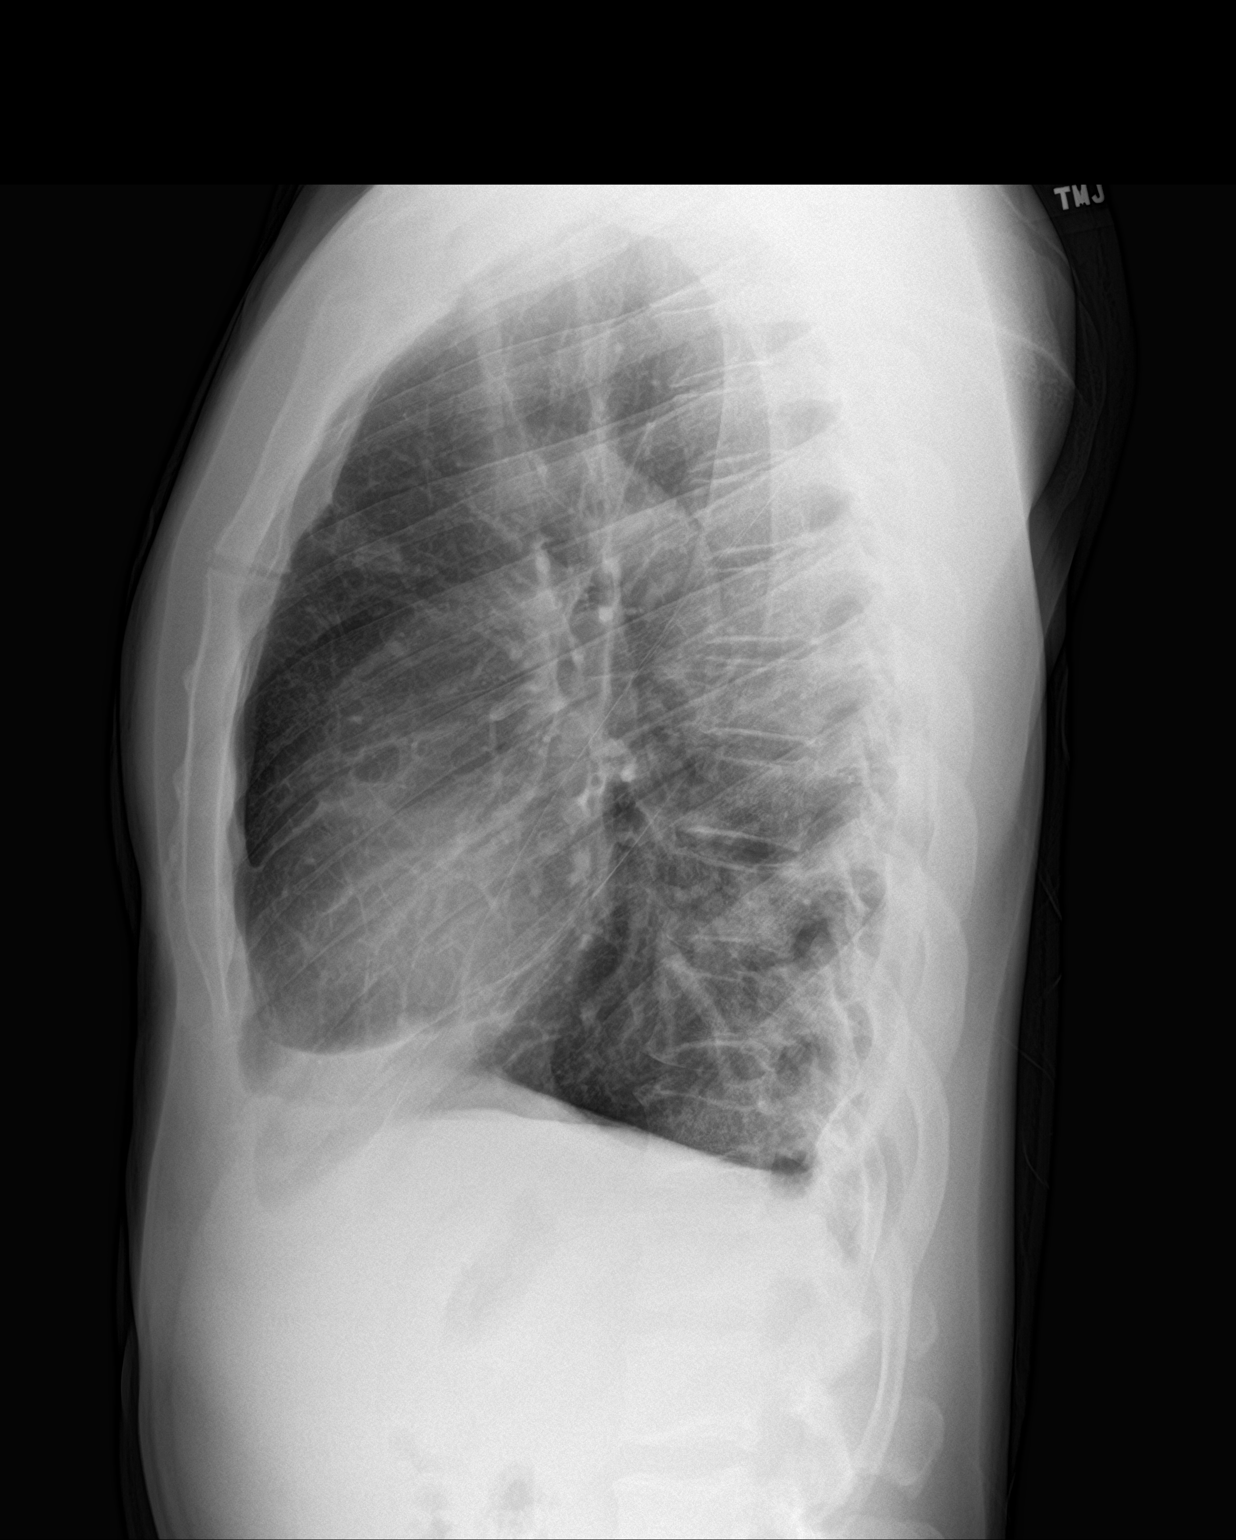

[2 of 2 positions shown; findings below may reference images not displayed]

FINDINGS: Cardiac shadow is within normal limits. The lungs are well aerated
bilaterally. Stable right pneumothorax is again seen without
enlargement. Minimal right pleural effusion is again seen and
stable. No focal infiltrate is noted. No acute bony abnormality is
seen.
IMPRESSION: Stable right pneumothorax.  No new focal abnormality is seen.
